# Patient Record
Sex: Female | Born: 1965 | Race: White | Hispanic: No | Marital: Married | Smoking: Never smoker
Health system: Southern US, Community
[De-identification: ages and names within clinical notes are randomized; demographics above are authoritative.]

## PROBLEM LIST (undated history)

## (undated) DIAGNOSIS — H6992 Unspecified Eustachian tube disorder, left ear: Secondary | ICD-10-CM

## (undated) DIAGNOSIS — R002 Palpitations: Secondary | ICD-10-CM

## (undated) DIAGNOSIS — R5383 Other fatigue: Secondary | ICD-10-CM

## (undated) DIAGNOSIS — K219 Gastro-esophageal reflux disease without esophagitis: Secondary | ICD-10-CM

## (undated) HISTORY — DX: Palpitations: R00.2

## (undated) HISTORY — PX: OTHER SURGICAL HISTORY: SHX169

## (undated) HISTORY — PX: ABDOMINAL HYSTERECTOMY: SHX81

## (undated) HISTORY — DX: Gastro-esophageal reflux disease without esophagitis: K21.9

## (undated) HISTORY — DX: Unspecified Eustachian tube disorder, left ear: H69.92

## (undated) HISTORY — DX: Other fatigue: R53.83

## (undated) HISTORY — PX: BREAST BIOPSY: SHX20

## (undated) HISTORY — PX: CHOLECYSTECTOMY: SHX55

---

## 2007-07-31 ENCOUNTER — Ambulatory Visit: Payer: Self-pay | Admitting: Cardiology

## 2011-12-14 DIAGNOSIS — R072 Precordial pain: Secondary | ICD-10-CM

## 2013-10-15 ENCOUNTER — Other Ambulatory Visit: Payer: Self-pay | Admitting: Unknown Physician Specialty

## 2013-10-15 DIAGNOSIS — R921 Mammographic calcification found on diagnostic imaging of breast: Secondary | ICD-10-CM

## 2013-10-22 ENCOUNTER — Ambulatory Visit
Admission: RE | Admit: 2013-10-22 | Discharge: 2013-10-22 | Disposition: A | Discharge status: Home / Self Care | Payer: BC Managed Care – PPO | Source: Ambulatory Visit | Attending: Unknown Physician Specialty | Admitting: Unknown Physician Specialty

## 2013-10-22 ENCOUNTER — Other Ambulatory Visit: Payer: Self-pay | Admitting: Unknown Physician Specialty

## 2013-10-22 DIAGNOSIS — R921 Mammographic calcification found on diagnostic imaging of breast: Secondary | ICD-10-CM

## 2014-05-05 ENCOUNTER — Other Ambulatory Visit: Payer: Self-pay | Admitting: Unknown Physician Specialty

## 2014-05-05 DIAGNOSIS — R921 Mammographic calcification found on diagnostic imaging of breast: Secondary | ICD-10-CM

## 2014-06-24 ENCOUNTER — Ambulatory Visit
Admission: RE | Admit: 2014-06-24 | Discharge: 2014-06-24 | Disposition: A | Discharge status: Home / Self Care | Payer: BC Managed Care – PPO | Source: Ambulatory Visit | Attending: Unknown Physician Specialty | Admitting: Unknown Physician Specialty

## 2014-06-24 DIAGNOSIS — R921 Mammographic calcification found on diagnostic imaging of breast: Secondary | ICD-10-CM

## 2014-07-04 ENCOUNTER — Other Ambulatory Visit: Payer: Self-pay

## 2014-07-04 DIAGNOSIS — Z1231 Encounter for screening mammogram for malignant neoplasm of breast: Secondary | ICD-10-CM

## 2014-09-03 ENCOUNTER — Ambulatory Visit
Admission: RE | Admit: 2014-09-03 | Discharge: 2014-09-03 | Disposition: A | Discharge status: Home / Self Care | Payer: BLUE CROSS/BLUE SHIELD | Source: Ambulatory Visit

## 2014-09-03 DIAGNOSIS — Z1231 Encounter for screening mammogram for malignant neoplasm of breast: Secondary | ICD-10-CM

## 2015-09-29 ENCOUNTER — Other Ambulatory Visit: Payer: Self-pay

## 2015-09-29 DIAGNOSIS — Z1231 Encounter for screening mammogram for malignant neoplasm of breast: Secondary | ICD-10-CM

## 2015-11-05 ENCOUNTER — Ambulatory Visit
Admission: RE | Admit: 2015-11-05 | Discharge: 2015-11-05 | Disposition: A | Discharge status: Home / Self Care | Payer: BLUE CROSS/BLUE SHIELD | Source: Ambulatory Visit

## 2015-11-05 DIAGNOSIS — Z1231 Encounter for screening mammogram for malignant neoplasm of breast: Secondary | ICD-10-CM

## 2016-09-30 ENCOUNTER — Other Ambulatory Visit: Payer: Self-pay | Admitting: Unknown Physician Specialty

## 2016-09-30 DIAGNOSIS — Z1231 Encounter for screening mammogram for malignant neoplasm of breast: Secondary | ICD-10-CM

## 2016-11-07 ENCOUNTER — Ambulatory Visit
Admission: RE | Admit: 2016-11-07 | Discharge: 2016-11-07 | Disposition: A | Discharge status: Home / Self Care | Payer: BLUE CROSS/BLUE SHIELD | Source: Ambulatory Visit | Attending: Unknown Physician Specialty | Admitting: Unknown Physician Specialty

## 2016-11-07 DIAGNOSIS — Z1231 Encounter for screening mammogram for malignant neoplasm of breast: Secondary | ICD-10-CM

## 2017-10-16 ENCOUNTER — Other Ambulatory Visit: Payer: Self-pay | Admitting: Unknown Physician Specialty

## 2017-10-16 DIAGNOSIS — Z1231 Encounter for screening mammogram for malignant neoplasm of breast: Secondary | ICD-10-CM

## 2017-11-13 ENCOUNTER — Ambulatory Visit: Payer: BLUE CROSS/BLUE SHIELD

## 2017-12-05 ENCOUNTER — Ambulatory Visit
Admission: RE | Admit: 2017-12-05 | Discharge: 2017-12-05 | Disposition: A | Discharge status: Home / Self Care | Payer: BLUE CROSS/BLUE SHIELD | Source: Ambulatory Visit | Attending: Unknown Physician Specialty | Admitting: Unknown Physician Specialty

## 2017-12-05 DIAGNOSIS — Z1231 Encounter for screening mammogram for malignant neoplasm of breast: Secondary | ICD-10-CM

## 2018-05-28 ENCOUNTER — Encounter: Payer: Self-pay | Admitting: Internal Medicine

## 2018-11-13 ENCOUNTER — Other Ambulatory Visit: Payer: Self-pay | Admitting: Unknown Physician Specialty

## 2018-11-13 DIAGNOSIS — Z1231 Encounter for screening mammogram for malignant neoplasm of breast: Secondary | ICD-10-CM

## 2018-11-23 ENCOUNTER — Encounter: Payer: Self-pay | Admitting: Internal Medicine

## 2018-12-24 ENCOUNTER — Ambulatory Visit (INDEPENDENT_AMBULATORY_CARE_PROVIDER_SITE_OTHER): Payer: BLUE CROSS/BLUE SHIELD | Admitting: Internal Medicine

## 2019-01-09 ENCOUNTER — Other Ambulatory Visit: Payer: Self-pay

## 2019-01-09 ENCOUNTER — Ambulatory Visit
Admission: RE | Admit: 2019-01-09 | Discharge: 2019-01-09 | Disposition: A | Discharge status: Home / Self Care | Payer: BC Managed Care – PPO | Source: Ambulatory Visit | Attending: Unknown Physician Specialty | Admitting: Unknown Physician Specialty

## 2019-01-09 DIAGNOSIS — Z1231 Encounter for screening mammogram for malignant neoplasm of breast: Secondary | ICD-10-CM

## 2019-01-30 ENCOUNTER — Encounter (INDEPENDENT_AMBULATORY_CARE_PROVIDER_SITE_OTHER): Payer: Self-pay | Admitting: Internal Medicine

## 2019-01-30 ENCOUNTER — Ambulatory Visit (INDEPENDENT_AMBULATORY_CARE_PROVIDER_SITE_OTHER): Payer: BC Managed Care – PPO | Admitting: Internal Medicine

## 2019-01-30 ENCOUNTER — Other Ambulatory Visit: Payer: Self-pay

## 2019-01-30 VITALS — BP 132/82 | HR 88 | Temp 97.9°F | Ht 66.0 in | Wt 193.3 lb

## 2019-01-30 DIAGNOSIS — R109 Unspecified abdominal pain: Secondary | ICD-10-CM | POA: Insufficient documentation

## 2019-01-30 DIAGNOSIS — R1010 Upper abdominal pain, unspecified: Secondary | ICD-10-CM | POA: Diagnosis not present

## 2019-01-30 DIAGNOSIS — K529 Noninfective gastroenteritis and colitis, unspecified: Secondary | ICD-10-CM

## 2019-01-30 LAB — CBC
HCT: 35.9 % (ref 35.0–45.0)
Hemoglobin: 12 g/dL (ref 11.7–15.5)
MCH: 28 pg (ref 27.0–33.0)
MCHC: 33.4 g/dL (ref 32.0–36.0)
MCV: 83.7 fL (ref 80.0–100.0)
MPV: 11.8 fL (ref 7.5–12.5)
Platelets: 265 10*3/uL (ref 140–400)
RBC: 4.29 10*6/uL (ref 3.80–5.10)
RDW: 12.7 % (ref 11.0–15.0)
WBC: 7.1 10*3/uL (ref 3.8–10.8)

## 2019-01-30 LAB — SEDIMENTATION RATE: Sed Rate: 22 mm/h (ref 0–30)

## 2019-01-30 NOTE — Progress Notes (Addendum)
   Subjective:    Patient ID: Jillian Holt, female    DOB: 27-Sep-1965, 53 y.o.   MRN: [id number]  HPI Referred by Dr. Ladona Horns for possible enteritis. Saw Dr. Quintin Alto for abdominal pain back in May.   She was placed on Cipro and Flagyl after she underwent the CT in May.  She says the pain is  better. She tells me today, the spasms sensation is some better. She does occasionally have abdominal spasms in her upper abdomen. Marland Kitchen Appetite is good. Has gained 25 pounds in the past 2 years. Her BMs move okay.  No change in her appetite.   05/28/2018 Colonoscopy Dr Anthony Sar. Screening . Normal.   11/23/2018 CT  Abdomen/pelvis with CM: Long segment mild wall thickening and somewhat patulous appearance of the descending and sigmoid though conceivably an enteritis could have a similar appearance.   Review of Systems Past Medical History:  Diagnosis Date  . GERD (gastroesophageal reflux disease)       Allergies  Allergen Reactions  . Bactrim [Sulfamethoxazole-Trimethoprim]     Rash   . Erythromycin     Cannot tolerate  . Penicillins     rash    Current Outpatient Medications on File Prior to Visit  Medication Sig Dispense Refill  . Cholecalciferol (VITAMIN D3) 10 MCG (400 UNIT) CAPS Take by mouth.    . citalopram (CELEXA) 20 MG tablet Take 20 mg by mouth daily.    . cyanocobalamin 100 MCG tablet Take 100 mcg by mouth. As needed    . Ferrous Gluconate (IRON 27 PO) Take by mouth. As needed    . Multiple Vitamin (MULTIVITAMIN) tablet Take 1 tablet by mouth daily.    Marland Kitchen omeprazole (PRILOSEC) 20 MG capsule Take 20 mg by mouth daily.    . Zinc Sulfate (ZINC 15 PO) Take by mouth. As needed     No current facility-administered medications on file prior to visit.         Objective:   Physical Exam Blood pressure 132/82, pulse 88, temperature 97.9 F (36.6 C), height 5\' 6"  (1.676 m), weight 193 lb 4.8 oz (87.7 kg), last menstrual period 01/09/2019. Alert and oriented. Skin warm and dry. Oral mucosa  is moist.   . Sclera anicteric, conjunctivae is pink. Thyroid not enlarged. No cervical lymphadenopathy. Lungs clear. Heart regular rate and rhythm.  Abdomen is soft. Bowel sounds are positive. No hepatomegaly. No abdominal masses felt. No tenderness.  No edema to lower extremities.         Assessment & Plan:  Abdominal pain, possible ernteritis, resolved. Will get a CBC and sedrate. Further recommendations to follow.

## 2019-01-30 NOTE — Patient Instructions (Signed)
Gastroesophageal Reflux Disease, Adult Gastroesophageal reflux (GER) happens when acid from the stomach flows up into the tube that connects the mouth and the stomach (esophagus). Normally, food travels down the esophagus and stays in the stomach to be digested. However, when a person has GER, food and stomach acid sometimes move back up into the esophagus. If this becomes a more serious problem, the person may be diagnosed with a disease called gastroesophageal reflux disease (GERD). GERD occurs when the reflux:  Happens often.  Causes frequent or severe symptoms.  Causes problems such as damage to the esophagus. When stomach acid comes in contact with the esophagus, the acid may cause soreness (inflammation) in the esophagus. Over time, GERD may create small holes (ulcers) in the lining of the esophagus. What are the causes? This condition is caused by a problem with the muscle between the esophagus and the stomach (lower esophageal sphincter, or LES). Normally, the LES muscle closes after food passes through the esophagus to the stomach. When the LES is weakened or abnormal, it does not close properly, and that allows food and stomach acid to go back up into the esophagus. The LES can be weakened by certain dietary substances, medicines, and medical conditions, including:  Tobacco use.  Pregnancy.  Having a hiatal hernia.  Alcohol use.  Certain foods and beverages, such as coffee, chocolate, onions, and peppermint. What increases the risk? You are more likely to develop this condition if you:  Have an increased body weight.  Have a connective tissue disorder.  Use NSAID medicines. What are the signs or symptoms? Symptoms of this condition include:  Heartburn.  Difficult or painful swallowing.  The feeling of having a lump in the throat.  Abitter taste in the mouth.  Bad breath.  Having a large amount of saliva.  Having an upset or bloated stomach.  Belching.   Chest pain. Different conditions can cause chest pain. Make sure you see your health care provider if you experience chest pain.  Shortness of breath or wheezing.  Ongoing (chronic) cough or a night-time cough.  Wearing away of tooth enamel.  Weight loss. How is this diagnosed? Your health care provider will take a medical history and perform a physical exam. To determine if you have mild or severe GERD, your health care provider may also monitor how you respond to treatment. You may also have tests, including:  A test to examine your stomach and esophagus with a small camera (endoscopy).  A test thatmeasures the acidity level in your esophagus.  A test thatmeasures how much pressure is on your esophagus.  A barium swallow or modified barium swallow test to show the shape, size, and functioning of your esophagus. How is this treated? The goal of treatment is to help relieve your symptoms and to prevent complications. Treatment for this condition may vary depending on how severe your symptoms are. Your health care provider may recommend:  Changes to your diet.  Medicine.  Surgery. Follow these instructions at home: Eating and drinking   Follow a diet as recommended by your health care provider. This may involve avoiding foods and drinks such as: ? Coffee and tea (with or without caffeine). ? Drinks that containalcohol. ? Energy drinks and sports drinks. ? Carbonated drinks or sodas. ? Chocolate and cocoa. ? Peppermint and mint flavorings. ? Garlic and onions. ? Horseradish. ? Spicy and acidic foods, including peppers, chili powder, curry powder, vinegar, hot sauces, and barbecue sauce. ? Citrus fruit juices and citrus   fruits, such as oranges, lemons, and limes. ? Tomato-based foods, such as red sauce, chili, salsa, and pizza with red sauce. ? Fried and fatty foods, such as donuts, french fries, potato chips, and high-fat dressings. ? High-fat meats, such as hot dogs and  fatty cuts of red and white meats, such as rib eye steak, sausage, ham, and bacon. ? High-fat dairy items, such as whole milk, butter, and cream cheese.  Eat small, frequent meals instead of large meals.  Avoid drinking large amounts of liquid with your meals.  Avoid eating meals during the 2-3 hours before bedtime.  Avoid lying down right after you eat.  Do not exercise right after you eat. Lifestyle   Do not use any products that contain nicotine or tobacco, such as cigarettes, e-cigarettes, and chewing tobacco. If you need help quitting, ask your health care provider.  Try to reduce your stress by using methods such as yoga or meditation. If you need help reducing stress, ask your health care provider.  If you are overweight, reduce your weight to an amount that is healthy for you. Ask your health care provider for guidance about a safe weight loss goal. General instructions  Pay attention to any changes in your symptoms.  Take over-the-counter and prescription medicines only as told by your health care provider. Do not take aspirin, ibuprofen, or other NSAIDs unless your health care provider told you to do so.  Wear loose-fitting clothing. Do not wear anything tight around your waist that causes pressure on your abdomen.  Raise (elevate) the head of your bed about 6 inches (15 cm).  Avoid bending over if this makes your symptoms worse.  Keep all follow-up visits as told by your health care provider. This is important. Contact a health care provider if:  You have: ? New symptoms. ? Unexplained weight loss. ? Difficulty swallowing or it hurts to swallow. ? Wheezing or a persistent cough. ? A hoarse voice.  Your symptoms do not improve with treatment. Get help right away if you:  Have pain in your arms, neck, jaw, teeth, or back.  Feel sweaty, dizzy, or light-headed.  Have chest pain or shortness of breath.  Vomit and your vomit looks like blood or coffee grounds.   Faint.  Have stool that is bloody or black.  Cannot swallow, drink, or eat. Summary  Gastroesophageal reflux happens when acid from the stomach flows up into the esophagus. GERD is a disease in which the reflux happens often, causes frequent or severe symptoms, or causes problems such as damage to the esophagus.  Treatment for this condition may vary depending on how severe your symptoms are. Your health care provider may recommend diet and lifestyle changes, medicine, or surgery.  Contact a health care provider if you have new or worsening symptoms.  Take over-the-counter and prescription medicines only as told by your health care provider. Do not take aspirin, ibuprofen, or other NSAIDs unless your health care provider told you to do so.  Keep all follow-up visits as told by your health care provider. This is important. This information is not intended to replace advice given to you by your health care provider. Make sure you discuss any questions you have with your health care provider. Document Released: 04/13/2005 Document Revised: 01/10/2018 Document Reviewed: 01/10/2018 Elsevier Patient Education  2020 [employer]  

## 2019-12-10 ENCOUNTER — Other Ambulatory Visit: Payer: Self-pay | Admitting: Family Medicine

## 2019-12-10 ENCOUNTER — Other Ambulatory Visit: Payer: Self-pay | Admitting: Unknown Physician Specialty

## 2019-12-10 DIAGNOSIS — Z1231 Encounter for screening mammogram for malignant neoplasm of breast: Secondary | ICD-10-CM

## 2020-01-13 ENCOUNTER — Other Ambulatory Visit: Payer: Self-pay

## 2020-01-13 ENCOUNTER — Ambulatory Visit: Payer: BC Managed Care – PPO

## 2020-01-13 ENCOUNTER — Ambulatory Visit
Admission: RE | Admit: 2020-01-13 | Discharge: 2020-01-13 | Disposition: A | Discharge status: Home / Self Care | Payer: BC Managed Care – PPO | Source: Ambulatory Visit | Attending: Unknown Physician Specialty | Admitting: Unknown Physician Specialty

## 2020-01-13 DIAGNOSIS — Z1231 Encounter for screening mammogram for malignant neoplasm of breast: Secondary | ICD-10-CM

## 2021-01-28 ENCOUNTER — Other Ambulatory Visit: Payer: Self-pay | Admitting: Family Medicine

## 2021-01-28 DIAGNOSIS — Z1231 Encounter for screening mammogram for malignant neoplasm of breast: Secondary | ICD-10-CM

## 2021-03-26 ENCOUNTER — Ambulatory Visit
Admission: RE | Admit: 2021-03-26 | Discharge: 2021-03-26 | Disposition: A | Discharge status: Home / Self Care | Payer: BC Managed Care – PPO | Source: Ambulatory Visit | Attending: Family Medicine | Admitting: Family Medicine

## 2021-03-26 ENCOUNTER — Other Ambulatory Visit: Payer: Self-pay

## 2021-03-26 DIAGNOSIS — Z1231 Encounter for screening mammogram for malignant neoplasm of breast: Secondary | ICD-10-CM

## 2022-02-16 ENCOUNTER — Other Ambulatory Visit: Payer: Self-pay | Admitting: Obstetrics and Gynecology

## 2022-02-16 DIAGNOSIS — Z1231 Encounter for screening mammogram for malignant neoplasm of breast: Secondary | ICD-10-CM

## 2022-03-28 ENCOUNTER — Ambulatory Visit
Admission: RE | Admit: 2022-03-28 | Discharge: 2022-03-28 | Disposition: A | Discharge status: Home / Self Care | Payer: BC Managed Care – PPO | Source: Ambulatory Visit | Attending: Obstetrics and Gynecology | Admitting: Obstetrics and Gynecology

## 2022-03-28 DIAGNOSIS — Z1231 Encounter for screening mammogram for malignant neoplasm of breast: Secondary | ICD-10-CM

## 2022-11-02 IMAGING — MG MM DIGITAL SCREENING BILAT W/ TOMO AND CAD
8 series · 8 of 24 positions shown · non-contrast
Comparison: Previous exam(s).

CLINICAL DATA: Screening.

EXAM:
DIGITAL SCREENING BILATERAL MAMMOGRAM WITH TOMOSYNTHESIS AND CAD
TECHNIQUE: Bilateral screening digital craniocaudal and mediolateral oblique
mammograms were obtained. Bilateral screening digital breast
tomosynthesis was performed. The images were evaluated with
computer-aided detection.

[L CC synth-2D]
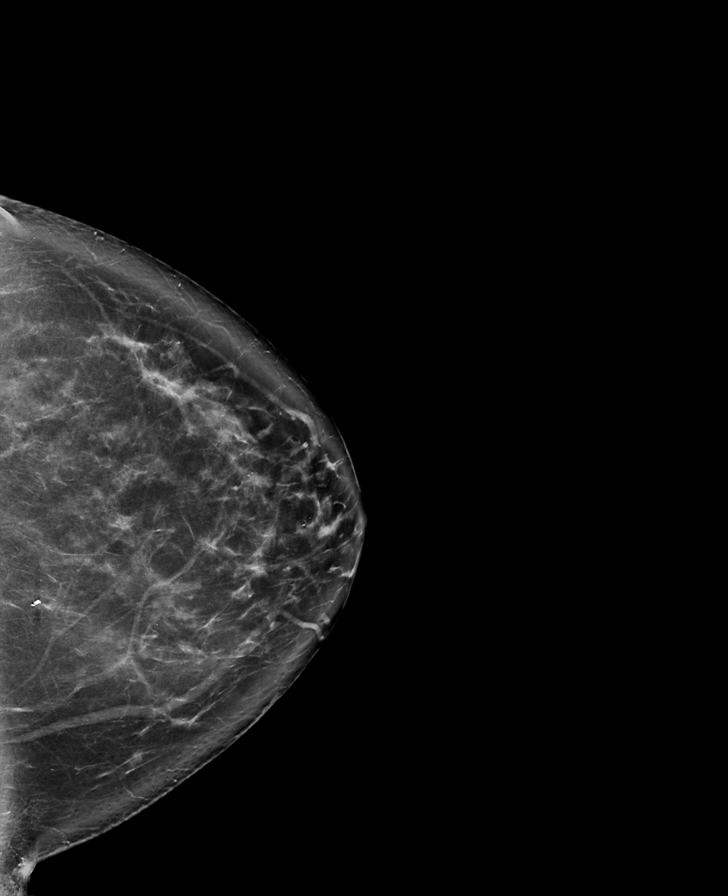

[R CC synth-2D]
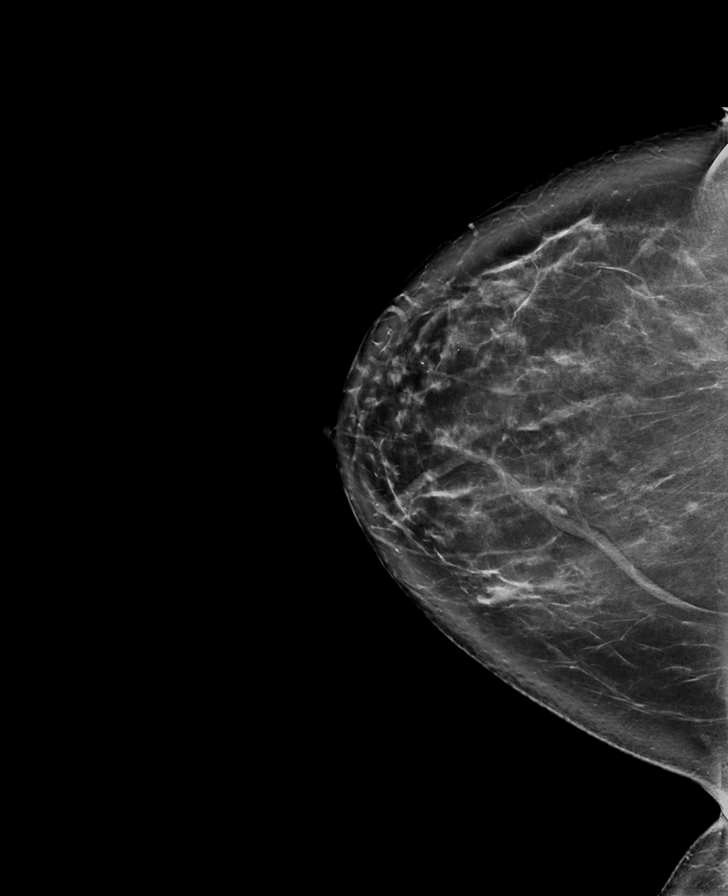

[L MLO synth-2D]
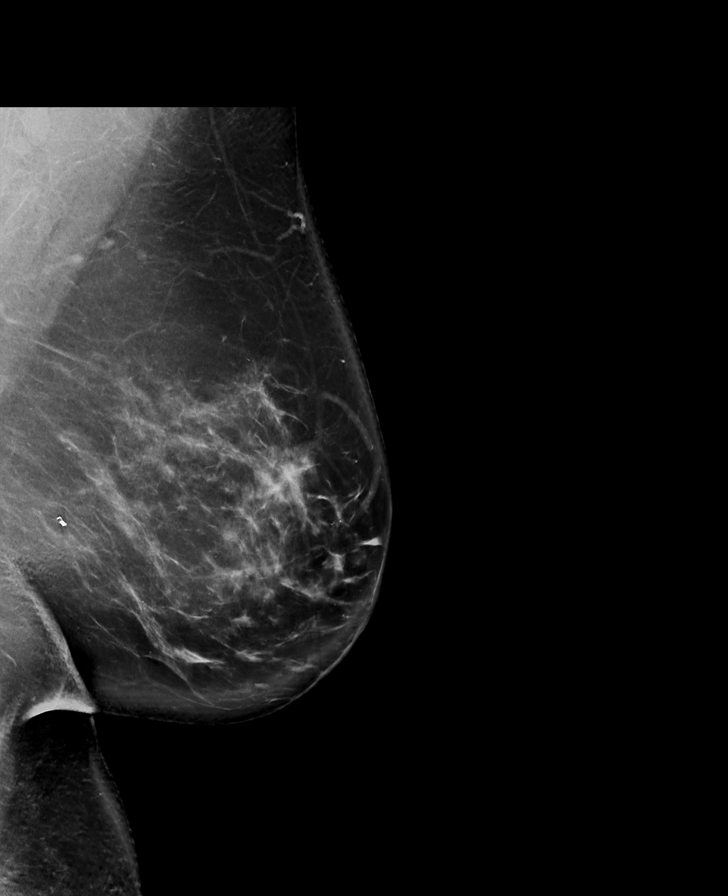

[R MLO synth-2D]
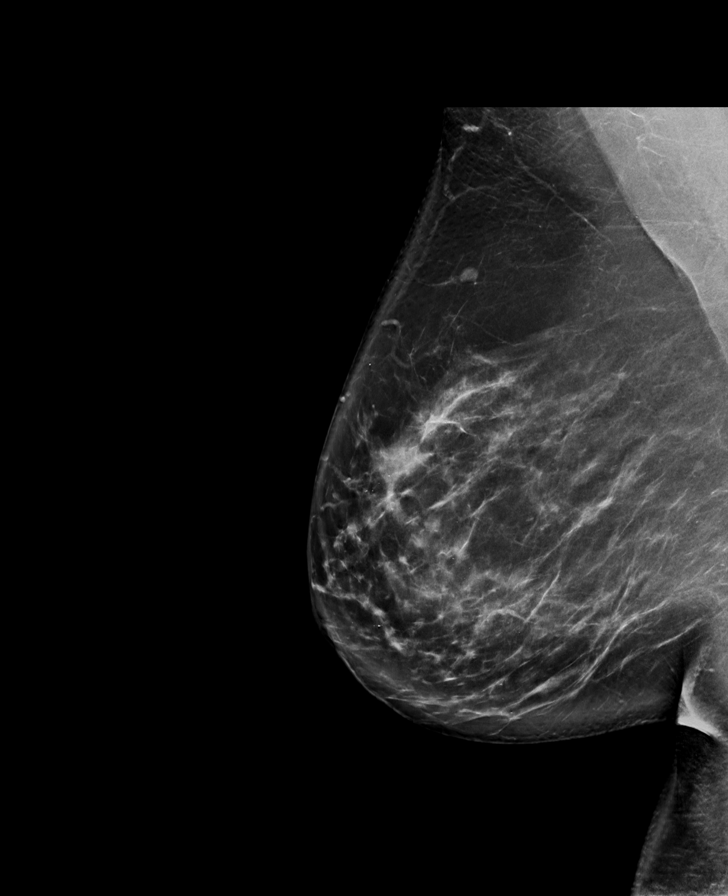

[R MLO tomo · tomo slice 53/104.0]
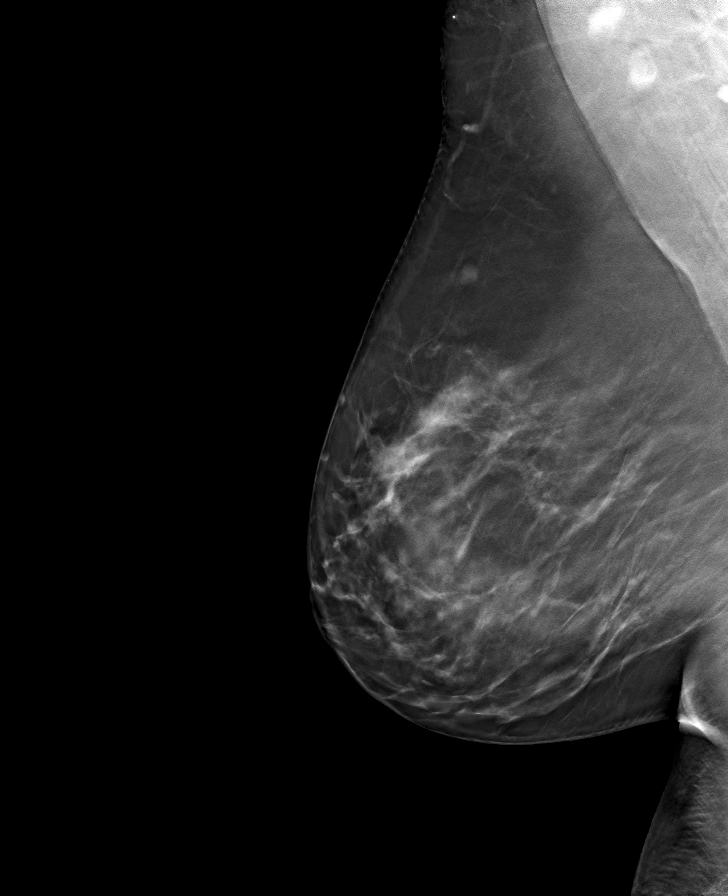

[L MLO tomo · tomo slice 54/107.0]
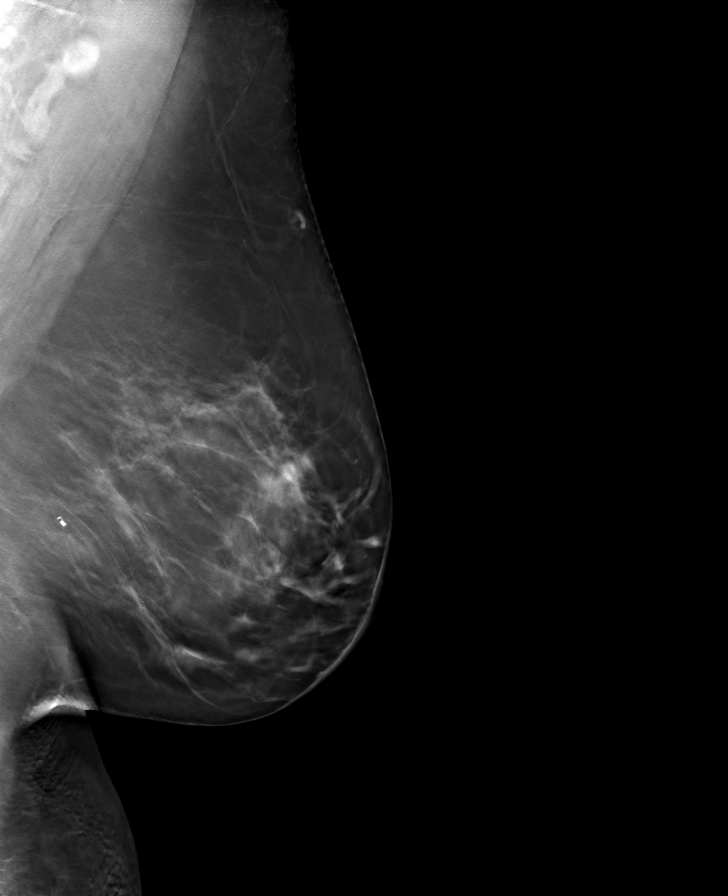

[R CC tomo · tomo slice 49/98.0]
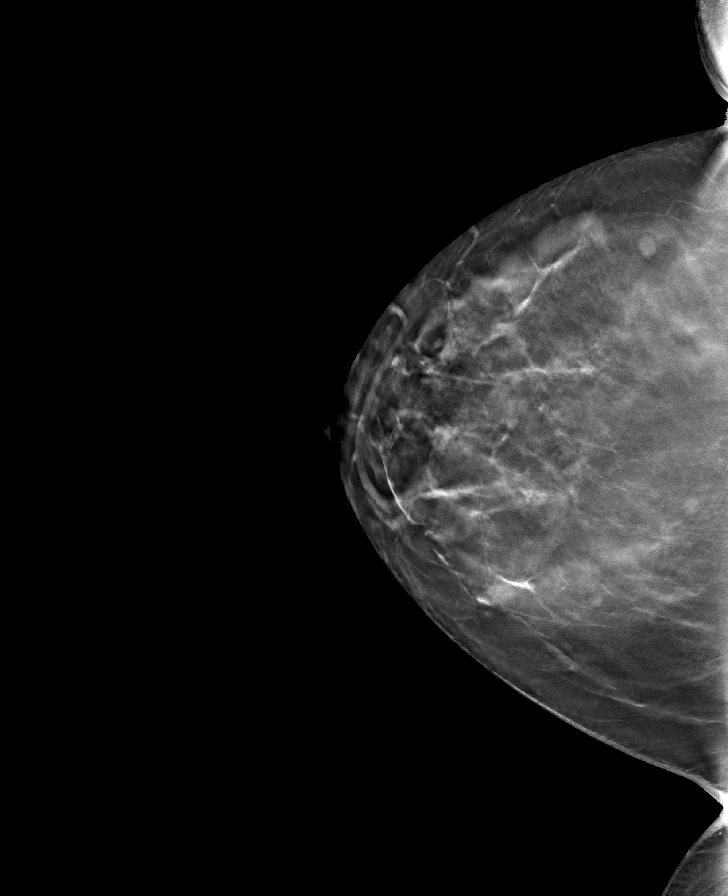

[L CC tomo · tomo slice 47/93.0]
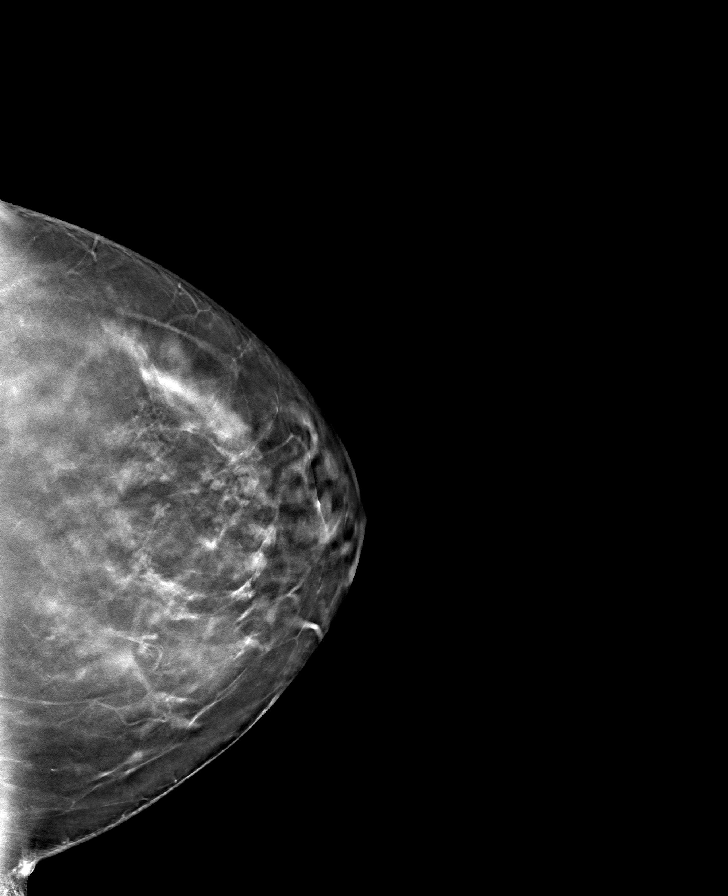

[8 of 24 positions shown; findings below may reference images not displayed]

ACR Breast Density Category b: There are scattered areas of
fibroglandular density.
FINDINGS: There are no findings suspicious for malignancy.
IMPRESSION: No mammographic evidence of malignancy. A result letter of this
screening mammogram will be mailed directly to the patient.

RECOMMENDATION:
Screening mammogram in one year. (Code:51-O-LD2)

BI-RADS CATEGORY  1: Negative.

## 2022-12-16 ENCOUNTER — Encounter: Payer: Self-pay | Admitting: *Deleted

## 2022-12-16 ENCOUNTER — Encounter: Payer: Self-pay | Admitting: Internal Medicine

## 2022-12-19 ENCOUNTER — Ambulatory Visit: Payer: BC Managed Care – PPO | Attending: Internal Medicine | Admitting: Internal Medicine

## 2022-12-19 ENCOUNTER — Encounter: Payer: Self-pay | Admitting: Internal Medicine

## 2022-12-19 VITALS — BP 130/80 | HR 80 | Ht 66.0 in | Wt 203.4 lb

## 2022-12-19 DIAGNOSIS — R0789 Other chest pain: Secondary | ICD-10-CM | POA: Diagnosis not present

## 2022-12-19 DIAGNOSIS — R002 Palpitations: Secondary | ICD-10-CM

## 2022-12-19 DIAGNOSIS — R079 Chest pain, unspecified: Secondary | ICD-10-CM | POA: Diagnosis not present

## 2022-12-19 MED ORDER — NITROGLYCERIN 0.4 MG SL SUBL: 0.4000 mg | SUBLINGUAL_TABLET | SUBLINGUAL | 2 refills | Status: AC | PRN

## 2022-12-19 MED ORDER — METOPROLOL TARTRATE 100 MG PO TABS: 100.0000 mg | ORAL_TABLET | Freq: Once | ORAL | 0 refills | Status: DC

## 2022-12-19 NOTE — Patient Instructions (Addendum)
Medication Instructions:  Your physician has recommended you make the following change in your medication:  Nitroglycerin 0.4 mg SL PRN Dissolve one under tongue for chest pain every 5 minutes up to 3 doses. If no relief, proceed to ED.   Labwork: BMET at [facility]  Testing/Procedures: Your physician has requested that you have an echocardiogram. Echocardiography is a painless test that uses sound waves to create images of your heart. It provides your doctor with information about the size and shape of your heart and how well your heart's chambers and valves are working. This procedure takes approximately one hour. There are no restrictions for this procedure. Please do NOT wear cologne, perfume, aftershave, or lotions (deodorant is allowed). Please arrive 15 minutes prior to your appointment time.  Non-Cardiac CT Angiography (CTA), is a special type of CT scan that uses a computer to produce multi-dimensional views of major blood vessels throughout the body. In CT angiography, a contrast material is injected through an IV to help visualize the blood vessels   Follow-Up: Your physician recommends that you schedule a follow-up appointment in: Pending   Any Other Special Instructions Will Be Listed Below (If Applicable).    Your cardiac CT will be scheduled at one of the below locations:   [facility] [address] [place], [place] [address] [phone]   If scheduled at [facility], please arrive at the [facility] and Children's Entrance (Entrance C2) of [facility] 30 minutes prior to test start time. You can use the FREE valet parking offered at entrance C (encouraged to control the heart rate for the test)  Proceed to the [facility] Radiology Department (first floor) to check-in and test prep.  All radiology patients and guests should use entrance C2 at [facility], accessed from [facility], even though the hospital's physical address  listed is [address]    If scheduled at [facility] or [facility], please arrive 15 mins early for check-in and test prep.   On the Night Before the Test: Be sure to Drink plenty of water. Do not consume any caffeinated/decaffeinated beverages or chocolate 12 hours prior to your test. Do not take any antihistamines 12 hours prior to your test. (Hold Zyrtec ) If the patient has contrast allergy: No allergy to contrast   On the Day of the Test: Drink plenty of water until 1 hour prior to the test. Do not eat any food 1 hour prior to test. You may take your regular medications prior to the test.  Take metoprolol 100mg (Lopressor) two hours prior to test. FEMALES- please wear underwire-free bra if available, avoid dresses & tight clothing       After the Test: Drink plenty of water. After receiving IV contrast, you may experience a mild flushed feeling. This is normal. On occasion, you may experience a mild rash up to 24 hours after the test. This is not dangerous. If this occurs, you can take Benadryl 25 mg and increase your fluid intake. If you experience trouble breathing, this can be serious. If it is severe call 911 IMMEDIATELY. If it is mild, please call our office. If you take any of these medications: Glipizide/Metformin, Avandament, Glucavance, please do not take 48 hours after completing test unless otherwise instructed.  We will call to schedule your test 2-4 weeks out understanding that some insurance companies will need an authorization prior to the service being performed.   For non-scheduling  related questions, please contact the cardiac imaging nurse navigator should you have any questions/concerns: Rockwell Alexandria, Cardiac Imaging Nurse Navigator Larey Brick, Cardiac Imaging Nurse Navigator [facility] Direct Office Dial: [phone]   For scheduling needs, including  cancellations and rescheduling, please call [place], [phone].   If you need a refill on your cardiac medications before your next appointment, please call your pharmacy.

## 2022-12-19 NOTE — Progress Notes (Signed)
Cardiology Office Note  Date: 12/19/2022   ID: Trystin, Etten 1965-11-12, MRN [id number]  PCP:  Estanislado Pandy, MD  Cardiologist:  Marjo Bicker, MD Electrophysiologist:  None   Reason for Office Visit: Evaluation of chest pressure at the request of Leavy Cella, PA-C   History of Present Illness: Jillian Holt is a 57 y.o. female with no PMH was referred to cardiology clinic for evaluation of chest pressure.  EKG from PCPs office showed NSR and LBBB (LBBB was new compared to EKG from last year). Patient also started to have substernal chest pressure, at rest and with exertion, since 4/24, last for a few minutes and resolve spontaneously. These episodes are sporadic and does not have a specific frequency. The chest pressure also radiates to her left side of her neck. She is adopted and does not know any family history. Her grandfather (biologically related) has some heart issues.  Denies DOE but feels lethargic and has no energy all the time.  She has palpitations but occurs 1-2 times per month.  No dizziness,syncope, leg swelling.  Past Medical History:  Diagnosis Date   Dysfunction of left eustachian tube    GERD (gastroesophageal reflux disease)    Other fatigue    Palpitations     Past Surgical History:  Procedure Laterality Date   c section     CHOLECYSTECTOMY      Current Outpatient Medications  Medication Sig Dispense Refill   ascorbic acid (VITAMIN C) 500 MG tablet Take 500 mg by mouth daily.     cetirizine (ZYRTEC) 10 MG tablet Take 10 mg by mouth daily.     cholecalciferol (VITAMIN D3) 25 MCG (1000 UNIT) tablet Take 3,000 Units by mouth daily.     citalopram (CELEXA) 20 MG tablet Take 20 mg by mouth daily.     cyanocobalamin 100 MCG tablet Take 100 mcg by mouth. As needed     fluticasone (FLONASE) 50 MCG/ACT nasal spray Place 2 sprays into both nostrils daily.     Menaquinone-7 (VITAMIN K2) 100 MCG CAPS Take 1 capsule by mouth daily.     [START ON 12/20/2022]  metoprolol tartrate (LOPRESSOR) 100 MG tablet Take 1 tablet (100 mg total) by mouth once for 1 dose. 1 tablet 0   nitroGLYCERIN (NITROSTAT) 0.4 MG SL tablet Place 1 tablet (0.4 mg total) under the tongue every 5 (five) minutes x 3 doses as needed for chest pain (If no relief after 3rd dose, proceed to the ED or call 911). 25 tablet 2   omeprazole (PRILOSEC) 20 MG capsule Take 20 mg by mouth daily.     Zinc Sulfate (ZINC 15 PO) Take 1 tablet by mouth once a week. As needed     No current facility-administered medications for this visit.   Allergies:  Bactrim [sulfamethoxazole-trimethoprim], Erythromycin, and Penicillins   Social History: The patient  reports that she has never smoked. She has never used smokeless tobacco. She reports that she does not drink alcohol and does not use drugs.   Family History: The patient's family history is not on file.   ROS:  Please see the history of present illness. Otherwise, complete review of systems is positive for none  All other systems are reviewed and negative.   Physical Exam: VS:  BP 130/80   Pulse 80   Ht 5\' 6"  (1.676 m)   Wt 203 lb 6.4 oz (92.3 kg)   SpO2 96%   BMI 32.83 kg/m , BMI Body  mass index is 32.83 kg/m.  Wt Readings from Last 3 Encounters:  12/19/22 203 lb 6.4 oz (92.3 kg)  01/30/19 193 lb 4.8 oz (87.7 kg)    General: Patient appears comfortable at rest. HEENT: Conjunctiva and lids normal, oropharynx clear with moist mucosa. Neck: Supple, no elevated JVP or carotid bruits, no thyromegaly. Lungs: Clear to auscultation, nonlabored breathing at rest. Cardiac: Regular rate and rhythm, no S3 or significant systolic murmur, no pericardial rub. Abdomen: Soft, nontender, no hepatomegaly, bowel sounds present, no guarding or rebound. Extremities: No pitting edema, distal pulses 2+. Skin: Warm and dry. Musculoskeletal: No kyphosis. Neuropsychiatric: Alert and oriented x3, affect grossly appropriate.  Recent Labwork: No results  found for requested labs within last 365 days.  No results found for: "CHOL", "TRIG", "HDL", "CHOLHDL", "VLDL", "LDLCALC", "LDLDIRECT"  Other Studies Reviewed Today:   Assessment and Plan:  Patient is a 56 year old F with no PMH was referred to cardiology clinic for evaluation of chest pressure.  # Chest pressure -Chest pressure radiating to the left side of her neck, at rest and with exertion, lasting for up minutes and happens multiple times.  Does not recall the frequency.  Ongoing since 4/24. EKG showed NSR and LBBB (which is new compared to the prior EKG from last year).  SL NTG 0.4 mg as needed. -Obtain CTA cardiac -Obtain 2D echocardiogram   I have spent a total of 30 minutes with patient reviewing chart, EKGs, labs and examining patient as well as establishing an assessment and plan that was discussed with the patient.  > 50% of time was spent in direct patient care.    Medication Adjustments/Labs and Tests Ordered: Current medicines are reviewed at length with the patient today.  Concerns regarding medicines are outlined above.   Tests Ordered: Orders Placed This Encounter  Procedures   CT CORONARY MORPH W/CTA COR W/SCORE W/CA W/CM &/OR WO/CM   Basic metabolic panel   EKG 12-Lead   ECHOCARDIOGRAM COMPLETE    Medication Changes: Meds ordered this encounter  Medications   nitroGLYCERIN (NITROSTAT) 0.4 MG SL tablet    Sig: Place 1 tablet (0.4 mg total) under the tongue every 5 (five) minutes x 3 doses as needed for chest pain (If no relief after 3rd dose, proceed to the ED or call 911).    Dispense:  25 tablet    Refill:  2    01/15/2023-*NEW   metoprolol tartrate (LOPRESSOR) 100 MG tablet    Sig: Take 1 tablet (100 mg total) by mouth once for 1 dose.    Dispense:  1 tablet    Refill:  0    12/19/2022-One dose only    Disposition:  Follow up  pending results  Signed Alic Hilburn Verne Spurr, MD, 12/19/2022 3:54 PM    [facility] at  [facility] [address] [place], [place], [place] [address]

## 2022-12-30 ENCOUNTER — Encounter: Payer: Self-pay | Admitting: Internal Medicine

## 2022-12-30 LAB — BASIC METABOLIC PANEL
BUN/Creatinine Ratio: 17 (ref 9–23)
BUN: 13 mg/dL (ref 6–24)
CO2: 23 mmol/L (ref 20–29)
Calcium: 9.4 mg/dL (ref 8.7–10.2)
Chloride: 102 mmol/L (ref 96–106)
Creatinine, Ser: 0.77 mg/dL (ref 0.57–1.00)
Glucose: 87 mg/dL (ref 70–99)
Potassium: 4.4 mmol/L (ref 3.5–5.2)
Sodium: 140 mmol/L (ref 134–144)
eGFR: 91 mL/min/{1.73_m2} (ref >59)

## 2023-01-04 ENCOUNTER — Encounter: Payer: Self-pay | Admitting: *Deleted

## 2023-01-06 ENCOUNTER — Ambulatory Visit (HOSPITAL_COMMUNITY)
Admission: RE | Admit: 2023-01-06 | Discharge: 2023-01-06 | Disposition: A | Discharge status: Home / Self Care | Payer: BC Managed Care – PPO | Source: Ambulatory Visit | Attending: Internal Medicine | Admitting: Internal Medicine

## 2023-01-06 DIAGNOSIS — R079 Chest pain, unspecified: Secondary | ICD-10-CM | POA: Diagnosis not present

## 2023-01-06 DIAGNOSIS — R9431 Abnormal electrocardiogram [ECG] [EKG]: Secondary | ICD-10-CM | POA: Diagnosis not present

## 2023-01-06 MED ORDER — NITROGLYCERIN 0.4 MG SL SUBL
SUBLINGUAL_TABLET | SUBLINGUAL | Status: AC
  Filled 2023-01-06: qty 2

## 2023-01-06 MED ORDER — NITROGLYCERIN 0.4 MG SL SUBL
0.8000 mg | SUBLINGUAL_TABLET | Freq: Once | SUBLINGUAL | Status: AC
  Administered 2023-01-06: 0.8 mg via SUBLINGUAL

## 2023-01-06 MED ORDER — IOHEXOL 350 MG/ML SOLN
95.0000 mL | Freq: Once | INTRAVENOUS | Status: AC | PRN
  Administered 2023-01-06: 95 mL via INTRAVENOUS

## 2023-01-09 ENCOUNTER — Telehealth: Payer: Self-pay

## 2023-01-09 ENCOUNTER — Telehealth: Payer: Self-pay | Admitting: Internal Medicine

## 2023-01-09 MED ORDER — ROSUVASTATIN CALCIUM 10 MG PO TABS: 10.0000 mg | ORAL_TABLET | Freq: Every day | ORAL | 3 refills | Status: DC

## 2023-01-09 NOTE — Telephone Encounter (Signed)
-----   Message from Marjo Bicker, MD sent at 01/09/2023 12:14 PM EDT ----- Coronary calcium score is 39 (90th percentile for age and sex matched control).  Minimal plaque in the heart vessels. Start moderate intensity statin, rosuvastatin 10 mg nightly (side effects are myalgias).if patient happens to experience myalgias, can switch rosuvastatin to 3 times a week and reevaluate symptoms. Follow-up with PCP for noncardiac causes of chest pain.  Recommend heart healthy diet and exercise regimen (30 minutes/day for 5 days a week).

## 2023-01-09 NOTE — Telephone Encounter (Signed)
Attempted to call pt back- phone went to voicemail.

## 2023-01-09 NOTE — Telephone Encounter (Signed)
Patient notified and verbalized understanding. PCP copied. 

## 2023-01-09 NOTE — Telephone Encounter (Signed)
Patient returned call for her test results.  °

## 2023-01-13 ENCOUNTER — Telehealth: Payer: Self-pay | Admitting: Internal Medicine

## 2023-01-13 NOTE — Telephone Encounter (Signed)
Checking percert on the following patient for testing scheduled at [facility].   ECHO   02/15/2023

## 2023-02-15 ENCOUNTER — Ambulatory Visit (HOSPITAL_COMMUNITY)
Admission: RE | Admit: 2023-02-15 | Discharge: 2023-02-15 | Disposition: A | Discharge status: Home / Self Care | Payer: BC Managed Care – PPO | Source: Ambulatory Visit | Attending: Internal Medicine | Admitting: Internal Medicine

## 2023-02-15 DIAGNOSIS — R079 Chest pain, unspecified: Secondary | ICD-10-CM

## 2023-02-15 MED ORDER — PERFLUTREN LIPID MICROSPHERE
1.0000 mL | INTRAVENOUS | Status: AC | PRN
  Administered 2023-02-15: 3 mL via INTRAVENOUS

## 2023-02-15 NOTE — Progress Notes (Signed)
*  PRELIMINARY RESULTS* Echocardiogram 2D Echocardiogram has been performed with Definity.  Stacey Drain 02/15/2023, 4:42 PM

## 2023-02-24 ENCOUNTER — Telehealth: Payer: Self-pay

## 2023-02-24 MED ORDER — METOPROLOL SUCCINATE ER 25 MG PO TB24: 25.0000 mg | ORAL_TABLET | Freq: Every day | ORAL | 3 refills | Status: DC

## 2023-02-24 MED ORDER — LOSARTAN POTASSIUM 25 MG PO TABS: 25.0000 mg | ORAL_TABLET | Freq: Every day | ORAL | 3 refills | Status: DC

## 2023-02-24 NOTE — Telephone Encounter (Signed)
Patient notified and verbalized understanding. PCP copied. 

## 2023-02-24 NOTE — Telephone Encounter (Signed)
-----   Message from Vishnu P Mallipeddi sent at 02/22/2023  6:17 PM EDT ----- Heart pumping function is mildly reduced at 45% (55 and more than 55 is considered to be normal), no valvular heart disease. CT cardiac showed no evidence of CAD which is reassuring. Need to start GDMT to help improve heart function. Start metoprolol succinate 25 mg once daily and losartan 25 mg once daily.  Hold medications for BP<90 mm Hg SBP.  Schedule follow-up in 1 month with MD/APP.

## 2023-03-01 ENCOUNTER — Encounter: Payer: Self-pay | Admitting: Internal Medicine

## 2023-03-30 ENCOUNTER — Ambulatory Visit: Payer: BC Managed Care – PPO | Attending: Internal Medicine | Admitting: Internal Medicine

## 2023-03-30 ENCOUNTER — Encounter: Payer: Self-pay | Admitting: Internal Medicine

## 2023-03-30 VITALS — BP 126/74 | HR 82 | Ht 65.0 in | Wt 202.2 lb

## 2023-03-30 DIAGNOSIS — I429 Cardiomyopathy, unspecified: Secondary | ICD-10-CM | POA: Diagnosis not present

## 2023-03-30 DIAGNOSIS — I5022 Chronic systolic (congestive) heart failure: Secondary | ICD-10-CM

## 2023-03-30 DIAGNOSIS — I10 Essential (primary) hypertension: Secondary | ICD-10-CM

## 2023-03-30 MED ORDER — ENTRESTO 24-26 MG PO TABS: 1.0000 | ORAL_TABLET | Freq: Two times a day (BID) | ORAL | 1 refills | Status: DC

## 2023-03-30 MED ORDER — LOSARTAN POTASSIUM 25 MG PO TABS: 25.0000 mg | ORAL_TABLET | Freq: Every day | ORAL | Status: DC

## 2023-03-30 NOTE — Progress Notes (Signed)
Cardiology Office Note  Date: 03/30/2023   ID: Jillian Holt, Macintosh 12-Dec-1965, MRN [id number]  PCP:  Estanislado Pandy, MD  Cardiologist:  Marjo Bicker, MD Electrophysiologist:  None    History of Present Illness:   Overall doing great, chest pain is completely resolved.  No DOE, orthopnea, PND.  Has mild SOB but not overt DOE.  No dizziness, presyncope and syncope.  No palpitations or leg swelling.  Checks blood pressures at home but immediately after exertion which has been the range of 130 and 140 mmHg.  She did have an episode of chest pressure but she was gardening on a hot sunny day when she also felt SOB.  She did not have any recurrence since then.  Past Medical History:  Diagnosis Date   Dysfunction of left eustachian tube    GERD (gastroesophageal reflux disease)    Other fatigue    Palpitations     Past Surgical History:  Procedure Laterality Date   c section     CHOLECYSTECTOMY      Current Outpatient Medications  Medication Sig Dispense Refill   ascorbic acid (VITAMIN C) 500 MG tablet Take 500 mg by mouth daily.     cetirizine (ZYRTEC) 10 MG tablet Take 10 mg by mouth daily.     cholecalciferol (VITAMIN D3) 25 MCG (1000 UNIT) tablet Take 3,000 Units by mouth daily.     citalopram (CELEXA) 20 MG tablet Take 20 mg by mouth daily.     cyanocobalamin 100 MCG tablet Take 100 mcg by mouth. As needed     fluticasone (FLONASE) 50 MCG/ACT nasal spray Place 2 sprays into both nostrils daily.     Menaquinone-7 (VITAMIN K2) 100 MCG CAPS Take 1 capsule by mouth daily.     metoprolol succinate (TOPROL XL) 25 MG 24 hr tablet Take 1 tablet (25 mg total) by mouth daily. 90 tablet 3   nitroGLYCERIN (NITROSTAT) 0.4 MG SL tablet Place 1 tablet (0.4 mg total) under the tongue every 5 (five) minutes x 3 doses as needed for chest pain (If no relief after 3rd dose, proceed to the ED or call 911). 25 tablet 2   omeprazole (PRILOSEC) 20 MG capsule Take 20 mg by mouth daily.      sacubitril-valsartan (ENTRESTO) 24-26 MG Take 1 tablet by mouth 2 (two) times daily. 180 tablet 1   Zinc Sulfate (ZINC 15 PO) Take 1 tablet by mouth once a week. As needed     losartan (COZAAR) 25 MG tablet Take 1 tablet (25 mg total) by mouth daily.     No current facility-administered medications for this visit.   Allergies:  Bactrim [sulfamethoxazole-trimethoprim], Erythromycin, and Penicillins   Social History: The patient  reports that she has never smoked. She has never used smokeless tobacco. She reports that she does not drink alcohol and does not use drugs.   Family History: The patient's family history is not on file. She was adopted.   ROS:  Please see the history of present illness. Otherwise, complete review of systems is positive for none  All other systems are reviewed and negative.   Physical Exam: VS:  BP 126/74   Pulse 82   Ht 5\' 5"  (1.651 m)   Wt 202 lb 3.2 oz (91.7 kg)   SpO2 97%   BMI 33.65 kg/m , BMI Body mass index is 33.65 kg/m.  Wt Readings from Last 3 Encounters:  03/30/23 202 lb 3.2 oz (91.7 kg)  12/19/22 203  lb 6.4 oz (92.3 kg)  01/30/19 193 lb 4.8 oz (87.7 kg)    General: Patient appears comfortable at rest. HEENT: Conjunctiva and lids normal, oropharynx clear with moist mucosa. Neck: Supple, no elevated JVP or carotid bruits, no thyromegaly. Lungs: Clear to auscultation, nonlabored breathing at rest. Cardiac: Regular rate and rhythm, no S3 or significant systolic murmur, no pericardial rub. Abdomen: Soft, nontender, no hepatomegaly, bowel sounds present, no guarding or rebound. Extremities: No pitting edema, distal pulses 2+. Skin: Warm and dry. Musculoskeletal: No kyphosis. Neuropsychiatric: Alert and oriented x3, affect grossly appropriate.  Recent Labwork: 12/29/2022: BUN 13; Creatinine, Ser 0.77; Potassium 4.4; Sodium 140  No results found for: "CHOL", "TRIG", "HDL", "CHOLHDL", "VLDL", "LDLCALC", "LDLDIRECT"    Assessment and  Plan:  Noncardiac chest pain: No interval chest pain.  Patient was initially referred to cardiology clinic for evaluation of chest pressure radiating to the left side of her neck, at rest and with exertion, lasting for up to minutes.  CT cardiac showed coronary calcium score of 39 (90th percentile for age and sex matched control) and mild TPV.  Follow-up with PCP for noncardiac chest pain.  In addition, she will benefit from statin therapy but she does not want to be on statin due to many side effects.  I instructed her to take over-the-counter red yeast rice capsules.  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  Patient already has 1 month supply of losartan, instructed her to switch to [facility] after 1 month.  Will repeat limited echocardiogram for improvement in LVEF.  She also has left bundle branch block on EKG which might be contributing to her reduced LVEF, unclear.  HTN, partially controlled: Home blood pressures range around 130 to 140 mmHg SBP.  However I did discuss the proper way to check blood pressure as she is checking immediately after exertion. Currently on metoprolol and losartan which will be switched to [facility], as stated above.  Instruct her to check her blood pressures in a.m. and p.m.  She started to eat healthy and exercise right.   I have spent a total of 30 minutes with patient reviewing chart, EKGs, labs and examining patient as well as establishing an assessment and plan that was discussed with the patient.  > 50% of time was spent in direct patient care.    Medication Adjustments/Labs and Tests Ordered: Current medicines are reviewed at length with the patient today.  Concerns regarding medicines are outlined above.   Tests Ordered: Orders Placed This Encounter  Procedures   ECHOCARDIOGRAM LIMITED    Medication Changes: Meds ordered this encounter   Medications   losartan (COZAAR) 25 MG tablet    Sig: Take 1 tablet (25 mg total) by mouth daily.   sacubitril-valsartan (ENTRESTO) 24-26 MG    Sig: Take 1 tablet by mouth 2 (two) times daily.    Dispense:  180 tablet    Refill:  1    03/30/2023-New    Disposition:  Follow up 3 months  Signed Marianita Botkin Verne Spurr, MD, 03/30/2023 1:21 PM    [facility] at [facility] [address] [place], [place], [place] [address]

## 2023-03-30 NOTE — Patient Instructions (Signed)
Medication Instructions:  Your physician has recommended you make the following change in your medication:  Stop taking Losartan once you finish current prescription recently picked up Then start Entresto 24-26 mg  twice daily Continue taking all other medications as prescribed  Labwork: None  Testing/Procedures: Your physician has requested that you have an echocardiogram. Echocardiography is a painless test that uses sound waves to create images of your heart. It provides your doctor with information about the size and shape of your heart and how well your heart's chambers and valves are working. This procedure takes approximately one hour. There are no restrictions for this procedure. Please do NOT wear cologne, perfume, aftershave, or lotions (deodorant is allowed). Please arrive 15 minutes prior to your appointment time.   Follow-Up: Your physician recommends that you schedule a follow-up appointment in: 3 month follow up  Any Other Special Instructions Will Be Listed Below (If Applicable).  If you need a refill on your cardiac medications before your next appointment, please call your pharmacy.

## 2023-05-23 ENCOUNTER — Other Ambulatory Visit: Payer: Self-pay | Admitting: Family Medicine

## 2023-05-23 DIAGNOSIS — Z1231 Encounter for screening mammogram for malignant neoplasm of breast: Secondary | ICD-10-CM

## 2023-05-30 ENCOUNTER — Ambulatory Visit: Payer: BC Managed Care – PPO | Attending: Internal Medicine

## 2023-05-30 DIAGNOSIS — I429 Cardiomyopathy, unspecified: Secondary | ICD-10-CM

## 2023-05-30 MED ORDER — PERFLUTREN LIPID MICROSPHERE
1.0000 mL | INTRAVENOUS | Status: AC | PRN
  Administered 2023-05-30: 3 mL via INTRAVENOUS

## 2023-05-31 LAB — ECHOCARDIOGRAM LIMITED
Area-P 1/2: 3.5 cm2
Calc EF: 43.2 %
Est EF: 45
S' Lateral: 3.9 cm
Single Plane A2C EF: 50.9 %
Single Plane A4C EF: 39.4 %

## 2023-06-13 ENCOUNTER — Ambulatory Visit
Admission: RE | Admit: 2023-06-13 | Discharge: 2023-06-13 | Disposition: A | Discharge status: Home / Self Care | Payer: BC Managed Care – PPO | Source: Ambulatory Visit | Attending: Family Medicine | Admitting: Family Medicine

## 2023-06-13 DIAGNOSIS — Z1231 Encounter for screening mammogram for malignant neoplasm of breast: Secondary | ICD-10-CM

## 2023-07-03 ENCOUNTER — Ambulatory Visit: Payer: BC Managed Care – PPO | Attending: Internal Medicine | Admitting: Internal Medicine

## 2023-07-03 VITALS — BP 114/70 | HR 62 | Ht 66.0 in | Wt 187.8 lb

## 2023-07-03 DIAGNOSIS — I10 Essential (primary) hypertension: Secondary | ICD-10-CM

## 2023-07-03 DIAGNOSIS — R002 Palpitations: Secondary | ICD-10-CM

## 2023-07-03 NOTE — Progress Notes (Signed)
Cardiology Office Note  Date: 07/03/2023   ID: Jillian Holt, Jillian Holt Jul 04, 1966, MRN [id number]  PCP:  Estanislado Pandy, MD  Cardiologist:  Marjo Bicker, MD Electrophysiologist:  None    History of Present Illness:  Repeat echocardiogram showed LVEF 45%, similar to prior echo.  No more chest pains.  She continues to have dizziness and unsteady gait, checked her blood pressure and heart rate at that time and was noted to be around 100s millimeters mercury SBP and HR around 70 bpm.  The lowest HR was 56 bpm.  She is also under a lot of stress recently in the last few weeks as her mother fell and had a fracture.  No DOE, orthopnea, PND, palpitations, leg swelling.  Past Medical History:  Diagnosis Date   Dysfunction of left eustachian tube    GERD (gastroesophageal reflux disease)    Other fatigue    Palpitations     Past Surgical History:  Procedure Laterality Date   BREAST BIOPSY Left    c section     CHOLECYSTECTOMY      Current Outpatient Medications  Medication Sig Dispense Refill   ascorbic acid (VITAMIN C) 500 MG tablet Take 500 mg by mouth daily.     cetirizine (ZYRTEC) 10 MG tablet Take 10 mg by mouth daily.     cholecalciferol (VITAMIN D3) 25 MCG (1000 UNIT) tablet Take 3,000 Units by mouth daily.     citalopram (CELEXA) 20 MG tablet Take 20 mg by mouth daily.     cyanocobalamin 100 MCG tablet Take 100 mcg by mouth. As needed     fluticasone (FLONASE) 50 MCG/ACT nasal spray Place 2 sprays into both nostrils daily.     losartan (COZAAR) 25 MG tablet Take 1 tablet (25 mg total) by mouth daily.     Menaquinone-7 (VITAMIN K2) 100 MCG CAPS Take 1 capsule by mouth daily.     metoprolol succinate (TOPROL XL) 25 MG 24 hr tablet Take 1 tablet (25 mg total) by mouth daily. 90 tablet 3   nitroGLYCERIN (NITROSTAT) 0.4 MG SL tablet Place 1 tablet (0.4 mg total) under the tongue every 5 (five) minutes x 3 doses as needed for chest pain (If no relief after 3rd dose, proceed to  the ED or call 911). 25 tablet 2   omeprazole (PRILOSEC) 20 MG capsule Take 20 mg by mouth daily.     sacubitril-valsartan (ENTRESTO) 24-26 MG Take 1 tablet by mouth 2 (two) times daily. 180 tablet 1   Zinc Sulfate (ZINC 15 PO) Take 1 tablet by mouth once a week. As needed     No current facility-administered medications for this visit.   Allergies:  Bactrim [sulfamethoxazole-trimethoprim], Erythromycin, and Penicillins   Social History: The patient  reports that she has never smoked. She has never used smokeless tobacco. She reports that she does not drink alcohol and does not use drugs.   Family History: The patient's family history is not on file. She was adopted.   ROS:  Please see the history of present illness. Otherwise, complete review of systems is positive for none  All other systems are reviewed and negative.   Physical Exam: VS:  There were no vitals taken for this visit., BMI There is no height or weight on file to calculate BMI.  Wt Readings from Last 3 Encounters:  03/30/23 202 lb 3.2 oz (91.7 kg)  12/19/22 203 lb 6.4 oz (92.3 kg)  01/30/19 193 lb 4.8 oz (87.7 kg)  General: Patient appears comfortable at rest. HEENT: Conjunctiva and lids normal, oropharynx clear with moist mucosa. Neck: Supple, no elevated JVP or carotid bruits, no thyromegaly. Lungs: Clear to auscultation, nonlabored breathing at rest. Cardiac: Regular rate and rhythm, no S3 or significant systolic murmur, no pericardial rub. Abdomen: Soft, nontender, no hepatomegaly, bowel sounds present, no guarding or rebound. Extremities: No pitting edema, distal pulses 2+. Skin: Warm and dry. Musculoskeletal: No kyphosis. Neuropsychiatric: Alert and oriented x3, affect grossly appropriate.  Recent Labwork: 12/29/2022: BUN 13; Creatinine, Ser 0.77; Potassium 4.4; Sodium 140  No results found for: "CHOL", "TRIG", "HDL", "CHOLHDL", "VLDL", "LDLCALC", "LDLDIRECT"    Assessment and Plan:  New onset  cardiomyopathy LVEF 45%, likely nonischemic: CT cardiac showed coronary calcium score of 39 and mild TPV.  Continue metoprolol succinate 25 mg once daily and Entresto 24-26 mg twice daily.  Repeat echocardiogram showed LVEF 45%, similar to prior echo.  Continue current medications, currently asymptomatic except for dizziness which I instructed her to monitor.  She is under a lot of stress/anxiety which might be contributing to her dizziness.  EKG today showed NSR, IVCD.  Noncardiac chest pain: No interval chest pain.  Follow-up with PCP for noncardiac causes of chest pain. CT cardiac showed coronary calcium score of 39 (90th percentile for age and sex matched control) and mild TPV.  Follow-up with PCP for noncardiac chest pain.  In addition, she will benefit from statin therapy but she does not want to be on statin due to many side effects.  I instructed her to take over-the-counter red yeast rice capsules  HTN, controlled: Continue above medications.   Medication Adjustments/Labs and Tests Ordered: Current medicines are reviewed at length with the patient today.  Concerns regarding medicines are outlined above.   Tests Ordered: Orders Placed This Encounter  Procedures   EKG 12-Lead    Medication Changes: No orders of the defined types were placed in this encounter.   Disposition:  Follow up 6 months  Signed Cletus Paris Verne Spurr, MD, 07/03/2023 1:12 PM    [facility] at [facility] [address] [place], [place], [place] [address]

## 2023-07-03 NOTE — Patient Instructions (Addendum)

## 2023-09-12 ENCOUNTER — Other Ambulatory Visit: Payer: Self-pay | Admitting: Internal Medicine

## 2023-10-11 ENCOUNTER — Telehealth: Payer: Self-pay | Admitting: Internal Medicine

## 2023-10-11 NOTE — Telephone Encounter (Signed)
 Pt c/o medication issue:  1. Name of Medication:  metoprolol succinate (TOPROL XL) 25 MG 24 hr tablet     2. How are you currently taking this medication (dosage and times per day)?  25 mg, Daily      3. Are you having a reaction (difficulty breathing--STAT)? no  4. What is your medication issue? Pt has been taking her husband med by mistake, he takes metoprolol tartrate 25mg , no symptoms besides feeling more fatigue. Requesting cb to discuss

## 2023-10-11 NOTE — Telephone Encounter (Signed)
 Patient notified and verbalized understanding.

## 2024-01-09 ENCOUNTER — Ambulatory Visit: Payer: BC Managed Care – PPO | Admitting: Internal Medicine

## 2024-01-12 ENCOUNTER — Encounter: Payer: Self-pay | Admitting: Internal Medicine

## 2024-01-12 ENCOUNTER — Ambulatory Visit: Attending: Internal Medicine | Admitting: Internal Medicine

## 2024-01-12 VITALS — BP 104/60 | HR 78 | Ht 65.0 in | Wt 208.0 lb

## 2024-01-12 DIAGNOSIS — R002 Palpitations: Secondary | ICD-10-CM

## 2024-01-12 DIAGNOSIS — I5022 Chronic systolic (congestive) heart failure: Secondary | ICD-10-CM | POA: Diagnosis not present

## 2024-01-12 DIAGNOSIS — E7849 Other hyperlipidemia: Secondary | ICD-10-CM | POA: Diagnosis not present

## 2024-01-12 NOTE — Progress Notes (Unsigned)
 Cardiology Office Note  Date: 01/12/2024   ID: Sarea, Fyfe [birth year], MRN [id number]  PCP:  Atilano Deward ORN, MD  Cardiologist:  Diannah SHAUNNA Maywood, MD Electrophysiologist:  None    History of Present Illness:  Repeat echocardiogram showed LVEF 45%, similar to prior echo.   Patient is here for follow-up visit with me. No interval ER visits or hospitalizations. Patient denied any rest or exertional chest discomfort, tightness, heaviness or pressure, rest or exertional dyspnea, palpitations, light-headedness, syncope and LE swelling. Compliant with medications and no side-effects. Doing great overall.   Past Medical History:  Diagnosis Date   Dysfunction of left eustachian tube    GERD (gastroesophageal reflux disease)    Other fatigue    Palpitations     Past Surgical History:  Procedure Laterality Date   BREAST BIOPSY Left    c section     CHOLECYSTECTOMY      Current Outpatient Medications  Medication Sig Dispense Refill   ascorbic acid (VITAMIN C) 500 MG tablet Take 500 mg by mouth daily.     cetirizine (ZYRTEC) 10 MG tablet Take 10 mg by mouth daily.     cholecalciferol (VITAMIN D3) 25 MCG (1000 UNIT) tablet Take 3,000 Units by mouth daily.     citalopram (CELEXA) 20 MG tablet Take 20 mg by mouth daily.     cyanocobalamin 100 MCG tablet Take 100 mcg by mouth. As needed     ENTRESTO  24-26 MG TAKE 1 TABLET BY MOUTH TWICE A DAY 60 tablet 5   Flaxseed, Linseed, (FLAX SEED OIL PO) Take 1 capsule by mouth daily.     fluticasone (FLONASE) 50 MCG/ACT nasal spray Place 2 sprays into both nostrils daily.     MAGNESIUM PO Take by mouth once a week.     Menaquinone-7 (VITAMIN K2) 100 MCG CAPS Take 1 capsule by mouth daily.     metoprolol  succinate (TOPROL  XL) 25 MG 24 hr tablet Take 1 tablet (25 mg total) by mouth daily. 90 tablet 3   nitroGLYCERIN  (NITROSTAT ) 0.4 MG SL tablet Place 1 tablet (0.4 mg total) under the tongue every 5 (five) minutes x 3 doses as needed for  chest pain (If no relief after 3rd dose, proceed to the ED or call 911). 25 tablet 2   omeprazole (PRILOSEC) 20 MG capsule Take 20 mg by mouth daily.     Zinc Sulfate (ZINC 15 PO) Take 1 tablet by mouth once a week. As needed     No current facility-administered medications for this visit.   Allergies:  Bactrim [sulfamethoxazole-trimethoprim], Erythromycin, and Penicillins   Social History: The patient  reports that she has never smoked. She has never used smokeless tobacco. She reports that she does not drink alcohol and does not use drugs.   Family History: The patient's family history is not on file. She was adopted.   ROS:  Please see the history of present illness. Otherwise, complete review of systems is positive for none  All other systems are reviewed and negative.   Physical Exam: VS:  BP 104/60   Pulse 78   Ht 5' 5 (1.651 m)   Wt 208 lb (94.3 kg)   SpO2 96%   BMI 34.61 kg/m , BMI Body mass index is 34.61 kg/m.  Wt Readings from Last 3 Encounters:  01/12/24 208 lb (94.3 kg)  07/03/23 187 lb 12.8 oz (85.2 kg)  03/30/23 202 lb 3.2 oz (91.7 kg)    General: Patient appears  comfortable at rest. HEENT: Conjunctiva and lids normal, oropharynx clear with moist mucosa. Neck: Supple, no elevated JVP or carotid bruits, no thyromegaly. Lungs: Clear to auscultation, nonlabored breathing at rest. Cardiac: Regular rate and rhythm, no S3 or significant systolic murmur, no pericardial rub. Abdomen: Soft, nontender, no hepatomegaly, bowel sounds present, no guarding or rebound. Extremities: No pitting edema, distal pulses 2+. Skin: Warm and dry. Musculoskeletal: No kyphosis. Neuropsychiatric: Alert and oriented x3, affect grossly appropriate.  Recent Labwork: No results found for requested labs within last 365 days.  No results found for: CHOL, TRIG, HDL, CHOLHDL, VLDL, LDLCALC, LDLDIRECT    Assessment and Plan:  New onset cardiomyopathy LVEF 45%, nonischemic: No  angina or DOE. Doing great. CT cardiac showed coronary calcium  score of 39 and mild TPV.  Continue metoprolol  succinate 25 mg once daily, Entresto  24-26 mg twice daily.  Repeat echocardiogram showed LVEF 45% similar to prior echo.  She does not have any symptoms now, will repeat limited echocardiogram in 6 months.  GDMT up titration limited by soft blood pressures.  EKG today showed NSR, LBBB.  HLD, unknown values: Total plaque volume is mild.  Goal LDL less than 100.  Not on statin.  Can follow with PCP.  HTN, controlled: Continue above medications.   Medication Adjustments/Labs and Tests Ordered: Current medicines are reviewed at length with the patient today.  Concerns regarding medicines are outlined above.   Tests Ordered: Orders Placed This Encounter  Procedures   EKG 12-Lead    Medication Changes: No orders of the defined types were placed in this encounter.   Disposition:  Follow up 6 months  Signed Elyon Zoll Priya Jabari Swoveland, MD, 01/12/2024 1:48 PM    [facility] at [facility] [address] [place], [place], [place] [address]

## 2024-01-12 NOTE — Patient Instructions (Signed)
 Medication Instructions:  Your physician recommends that you continue on your current medications as directed. Please refer to the Current Medication list given to you today.  *If you need a refill on your cardiac medications before your next appointment, please call your pharmacy*  Lab Work: None If you have labs (blood work) drawn today and your tests are completely normal, you will receive your results only by: MyChart Message (if you have MyChart) OR A paper copy in the mail If you have any lab test that is abnormal or we need to change your treatment, we will call you to review the results.  Testing/Procedures: Your physician has requested that you have an echocardiogram. Echocardiography is a painless test that uses sound waves to create images of your heart. It provides your doctor with information about the size and shape of your heart and how well your heart's chambers and valves are working. This procedure takes approximately one hour. There are no restrictions for this procedure. Please do NOT wear cologne, perfume, aftershave, or lotions (deodorant is allowed). Please arrive 15 minutes prior to your appointment time.  Please note: We ask at that you not bring children with you during ultrasound (echo/ vascular) testing. Due to room size and safety concerns, children are not allowed in the ultrasound rooms during exams. Our front office staff cannot provide observation of children in our lobby area while testing is being conducted. An adult accompanying a patient to their appointment will only be allowed in the ultrasound room at the discretion of the ultrasound technician under special circumstances. We apologize for any inconvenience.   Follow-Up: At [facility], you and your health needs are our priority.  As part of our continuing mission to provide you with exceptional heart care, our providers are all part of one team.  This team includes your primary Cardiologist  (physician) and Advanced Practice Providers or APPs (Physician Assistants and Nurse Practitioners) who all work together to provide you with the care you need, when you need it.  Your next appointment:   6 month(s)  Provider:   You may see Vishnu P Mallipeddi, MD or one of the following Advanced Practice Providers on your designated Care Team:   [place], PA-C  [place], [place] Olivia Pavy, [place]     We recommend signing up for the patient portal called MyChart.  Sign up information is provided on this After Visit Summary.  MyChart is used to connect with patients for Virtual Visits (Telemedicine).  Patients are able to view lab/test results, encounter notes, upcoming appointments, etc.  Non-urgent messages can be sent to your provider as well.   To learn more about what you can do with MyChart, go to [email or web address].   Other Instructions

## 2024-01-16 DIAGNOSIS — E785 Hyperlipidemia, unspecified: Secondary | ICD-10-CM | POA: Insufficient documentation

## 2024-02-06 ENCOUNTER — Other Ambulatory Visit (HOSPITAL_COMMUNITY)

## 2024-02-09 ENCOUNTER — Telehealth: Payer: Self-pay | Admitting: Internal Medicine

## 2024-02-09 NOTE — Telephone Encounter (Signed)
 Pt c/o medication issue:  1. Name of Medication: citalopram (CELEXA) 20 MG tablet   2. How are you currently taking this medication (dosage and times per day)? Take 20 mg by mouth daily.   3. Are you having a reaction (difficulty breathing--STAT)? No  4. What is your medication issue? Pt is wondering if citalopram will interfere with the 2 new medications she was just prescribed by her other doctor. She was diagnosed with diverticulitis.

## 2024-02-09 NOTE — Telephone Encounter (Signed)
 Pt states that pharmacy told her when she picked up cipro and flagyl to contact cardiology regarding taking this with Celexa. Encouraged pt to reach out to ordering doctor.

## 2024-02-12 NOTE — Telephone Encounter (Signed)
 Can increase the risk of prolonged QTc. Her baseline is a little long. Would recommend she ask her provider if there is an alternative treatment, if not, then would recommend she come in for EKG after a 2 days. Will also send to Dr. Mallipeddi for her input.

## 2024-02-12 NOTE — Telephone Encounter (Signed)
 Left message to return call

## 2024-02-13 NOTE — Telephone Encounter (Signed)
Called pt. No answer. Left msg to return call.   

## 2024-02-17 ENCOUNTER — Other Ambulatory Visit: Payer: Self-pay | Admitting: Internal Medicine

## 2024-02-29 ENCOUNTER — Encounter (INDEPENDENT_AMBULATORY_CARE_PROVIDER_SITE_OTHER): Payer: Self-pay | Admitting: *Deleted

## 2024-03-14 ENCOUNTER — Telehealth (INDEPENDENT_AMBULATORY_CARE_PROVIDER_SITE_OTHER): Payer: Self-pay

## 2024-03-14 ENCOUNTER — Ambulatory Visit (INDEPENDENT_AMBULATORY_CARE_PROVIDER_SITE_OTHER): Admitting: Gastroenterology

## 2024-03-14 ENCOUNTER — Encounter (INDEPENDENT_AMBULATORY_CARE_PROVIDER_SITE_OTHER): Payer: Self-pay | Admitting: Gastroenterology

## 2024-03-14 VITALS — BP 113/73 | HR 71 | Temp 97.3°F | Ht 65.0 in | Wt 205.3 lb

## 2024-03-14 DIAGNOSIS — R1030 Lower abdominal pain, unspecified: Secondary | ICD-10-CM | POA: Diagnosis not present

## 2024-03-14 DIAGNOSIS — K224 Dyskinesia of esophagus: Secondary | ICD-10-CM | POA: Insufficient documentation

## 2024-03-14 DIAGNOSIS — R1310 Dysphagia, unspecified: Secondary | ICD-10-CM

## 2024-03-14 DIAGNOSIS — E669 Obesity, unspecified: Secondary | ICD-10-CM | POA: Diagnosis not present

## 2024-03-14 DIAGNOSIS — Z6834 Body mass index (BMI) 34.0-34.9, adult: Secondary | ICD-10-CM | POA: Diagnosis not present

## 2024-03-14 DIAGNOSIS — K50019 Crohn's disease of small intestine with unspecified complications: Secondary | ICD-10-CM

## 2024-03-14 DIAGNOSIS — E6609 Other obesity due to excess calories: Secondary | ICD-10-CM

## 2024-03-14 DIAGNOSIS — K5000 Crohn's disease of small intestine without complications: Secondary | ICD-10-CM | POA: Insufficient documentation

## 2024-03-14 MED ORDER — PEG 3350-KCL-NA BICARB-NACL 420 G PO SOLR: 4000.0000 mL | Freq: Once | ORAL | 0 refills | Status: AC

## 2024-03-14 MED ORDER — HYOSCYAMINE SULFATE 0.125 MG SL SUBL: 0.1250 mg | SUBLINGUAL_TABLET | Freq: Four times a day (QID) | SUBLINGUAL | 3 refills | Status: AC | PRN

## 2024-03-14 NOTE — Progress Notes (Signed)
 Toribio Fortune, M.D. Gastroenterology & Hepatology [facility] Gastroenterology [address] [place], [place] [address] Primary Care Physician: Atilano Deward ORN, MD [address] [place] [address]  Referring MD: PCP  Chief Complaint:  abdominal pain  History of Present Illness: Jillian Holt is a 58 y.o. 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  Patient reports that for the last 4 months ago, she presented new onset of abdominal pain in the left side of her abdomen which was initially intermittent . However, the pain worsened and became constant. Due to this she was prescribed a week course of Flagyl and a second antibiotic by her PCP for presumed diverticulitis.  She also reported seeing blood in her stool once a week since 02/21/2024. Has one BM every, once a day  -stools are fluffy in consistency.  Patient was seen at [facility] on 02/22/2024, as he r PCP advised her to have urgent evaluation due to poor response to treatment. However given the persistence of pain she was seen in the hospital. CBC was within normal limits, CMP showed mild elevation of ALT of 51, AST mildly elevated 38, total bilirubin 0.3, alkaline phosphatase 98, normal renal function and electrolytes.  CT angio of the abdomen and pelvis was performed, report stated that there was presence of mild distal and terminal ileitis.  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  Patient reports feeling better since she went to the ER. States that the pain fluctuates intermittently and she feels weakness intermittently, also feeling similar as she will pass out. Pain is located in the LLQ and does not radiate - pain is 8/10 in severity. She reports that she has to be cautious with bending and leaning on her left side, to decrease her  abdominal pain. Occasionally has some bloating episodes.  She states that she has been taking ER oral iron supplementation - she started taking it on her own, has taking it as needed and makes her feel better in terms of her fatigue.  The patient denies having any nausea, vomiting, fever, chills, hematochezia, melena, hematemesis, abdominal distention, diarrhea, jaundice, pruritus. Has gained at least 5 lb despite not eating too much.Patient would like a referral to nutritionist for obesity.  Has been on omeprazole for multiple years, takes 20 mg qday. No heartburn. May have some occasional episodes of dysphagia, which has been attributed to jackhammer esophagus. She reports that she had a manometry in [facility] in 02/28/12 - per notes, manometry showed increased amplitude of peristaltic waves consistent with Jackhammer esophagus.  Esophageal pH (on PPI) Normal esophageal pH study (Note: patient had no symptoms during study.)  .  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 No reports available Last Colonoscopy: 05/28/2018, performed by Dr. Ivery at [facility] Normal colonoscopy  FHx: neg for any gastrointestinal/liver disease, no malignancies Social: neg smoking, alcohol or illicit drug use Surgical: complete hysterectomy, cholecystectomy, c-section  Past Medical History: Past Medical History:  Diagnosis Date   Dysfunction of left eustachian tube    GERD (gastroesophageal reflux disease)    Other fatigue    Palpitations     Past Surgical History: Past Surgical History:  Procedure Laterality Date   BREAST BIOPSY Left    c section     CHOLECYSTECTOMY  Family History: Family History  Adopted: Yes  Problem Relation Age of Onset   Breast cancer Neg Hx     Social History: Social History   Tobacco Use  Smoking Status Never  Smokeless Tobacco Never   Social History   Substance and Sexual  Activity  Alcohol Use Never   Social History   Substance and Sexual Activity  Drug Use Never    Allergies: Allergies  Allergen Reactions   Bactrim [Sulfamethoxazole-Trimethoprim]     Rash    Erythromycin     Cannot tolerate   Penicillins     rash    Medications: Current Outpatient Medications  Medication Sig Dispense Refill   ascorbic acid (VITAMIN C) 500 MG tablet Take 500 mg by mouth daily. (Patient taking differently: Take 500 mg by mouth daily. occasionally)     cetirizine (ZYRTEC) 10 MG tablet Take 10 mg by mouth daily.     cholecalciferol (VITAMIN D3) 25 MCG (1000 UNIT) tablet Take 3,000 Units by mouth daily.     citalopram (CELEXA) 20 MG tablet Take 20 mg by mouth daily. (Patient taking differently: Take 10 mg by mouth daily.)     cyanocobalamin 100 MCG tablet Take 100 mcg by mouth. As needed     ENTRESTO  24-26 MG TAKE 1 TABLET BY MOUTH TWICE A DAY 60 tablet 5   Flaxseed, Linseed, (FLAX SEED OIL PO) Take 1 capsule by mouth daily. (Patient taking differently: Take 1 capsule by mouth daily. Occasionally.)     fluticasone (FLONASE) 50 MCG/ACT nasal spray Place 2 sprays into both nostrils daily. (Patient taking differently: Place 2 sprays into both nostrils as needed.)     hyoscyamine  (LEVSIN  SL) 0.125 MG SL tablet Place 1 tablet (0.125 mg total) under the tongue every 6 (six) hours as needed (abdominal pain, pain when swallowing). 180 tablet 3   MAGNESIUM PO Take by mouth once a week.     Menaquinone-7 (VITAMIN K2) 100 MCG CAPS Take 1 capsule by mouth daily.     metoprolol  succinate (TOPROL -XL) 25 MG 24 hr tablet TAKE 1 TABLET (25 MG TOTAL) BY MOUTH DAILY. 90 tablet 3   nitroGLYCERIN  (NITROSTAT ) 0.4 MG SL tablet Place 1 tablet (0.4 mg total) under the tongue every 5 (five) minutes x 3 doses as needed for chest pain (If no relief after 3rd dose, proceed to the ED or call 911). 25 tablet 2   omeprazole (PRILOSEC) 20 MG capsule Take 20 mg by mouth daily.     Zinc Sulfate (ZINC  15 PO) Take 1 tablet by mouth once a week. As needed     No current facility-administered medications for this visit.    Review of Systems: GENERAL: negative for malaise, night sweats HEENT: No changes in hearing or vision, no nose bleeds or other nasal problems. NECK: Negative for lumps, goiter, pain and significant neck swelling RESPIRATORY: Negative for cough, wheezing CARDIOVASCULAR: Negative for chest pain, leg swelling, palpitations, orthopnea GI: SEE HPI MUSCULOSKELETAL: Negative for joint pain or swelling, back pain, and muscle pain. SKIN: Negative for lesions, rash PSYCH: Negative for sleep disturbance, mood disorder and recent psychosocial stressors. HEMATOLOGY Negative for prolonged bleeding, bruising easily, and swollen nodes. ENDOCRINE: Negative for cold or heat intolerance, polyuria, polydipsia and goiter. NEURO: negative for tremor, gait imbalance, syncope and seizures. The remainder of the review of systems is noncontributory.   Physical Exam: BP 113/73 (BP Location: Left Arm, Patient Position: Sitting, Cuff Size: Large)   Pulse 71   Temp (!) 97.3  F (36.3 C) (Temporal)   Ht 5' 5 (1.651 m)   Wt 205 lb 4.8 oz (93.1 kg)   LMP 01/09/2019   BMI 34.16 kg/m  GENERAL: The patient is AO x3, in no acute distress. HEENT: Head is normocephalic and atraumatic. EOMI are intact. Mouth is well hydrated and without lesions. NECK: Supple. No masses LUNGS: Clear to auscultation. No presence of rhonchi/wheezing/rales. Adequate chest expansion HEART: RRR, normal s1 and s2. ABDOMEN: Tender upon palpation of the lower abdomen , no guarding, no peritoneal signs, and nondistended. BS +. No masses. EXTREMITIES: Without any cyanosis, clubbing, rash, lesions or edema. NEUROLOGIC: AOx3, no focal motor deficit. SKIN: no jaundice, no rashes   Imaging/Labs: as above  I personally reviewed and interpreted the available labs, imaging and endoscopic files.  Impression and Plan: Jillian Holt is a 58 y.o. 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  Patient has presented some episodes of abdominal pain in her lower abdomen.  Most recent imaging showed presence of inflammation of the terminal ileum without associated diarrhea.  We discussed the importance of evaluating this further with a colonoscopy to rule out inflammatory causes of her symptoms.  She is agreeable to proceed with this.  For now, she will be provided symptom relief with Levsin  as needed.  In terms of her dysphagia episodes, she had a previous manometry showing changes concerning for jackhammer esophagus.  No GERD symptoms.  We discussed that as her symptoms have worsened recently, we will rule out endoluminal etiologies with EGD.  She can take Levsin  as well for the episodes of chest pain with swallowing as this is part of the management of jackhammer esophagus.  Regarding her obesity, she is interested to have nutritional management and counseling, so I will refer her to nutritionist.  -Schedule EGD and colonoscopy -Referral to nutritionist -Start Levsin  1 tablet q6h as needed for abdominal pain or difficulty swallowing/chest pain when swallowing  All questions were answered.      Toribio Fortune, MD Gastroenterology and Hepatology [facility]enter[facility]

## 2024-03-14 NOTE — Patient Instructions (Addendum)
 Schedule EGD and colonoscopy Referral to nutritionist Start Levsin  1 tablet q6h as needed for abdominal pain or difficulty swallowing/chest pain when swallowing

## 2024-03-14 NOTE — H&P (View-Only) (Signed)
 Toribio Fortune, M.D. Gastroenterology & Hepatology [facility] Gastroenterology [address] [place], [place] [address] Primary Care Physician: Atilano Deward ORN, MD [address] [place] [address]  Referring MD: PCP  Chief Complaint:  abdominal pain  History of Present Illness: Amirra Herling is a 58 y.o. 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  Patient reports that for the last 4 months ago, she presented new onset of abdominal pain in the left side of her abdomen which was initially intermittent . However, the pain worsened and became constant. Due to this she was prescribed a week course of Flagyl and a second antibiotic by her PCP for presumed diverticulitis.  She also reported seeing blood in her stool once a week since 02/21/2024. Has one BM every, once a day  -stools are fluffy in consistency.  Patient was seen at [facility] on 02/22/2024, as he r PCP advised her to have urgent evaluation due to poor response to treatment. However given the persistence of pain she was seen in the hospital. CBC was within normal limits, CMP showed mild elevation of ALT of 51, AST mildly elevated 38, total bilirubin 0.3, alkaline phosphatase 98, normal renal function and electrolytes.  CT angio of the abdomen and pelvis was performed, report stated that there was presence of mild distal and terminal ileitis.  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  Patient reports feeling better since she went to the ER. States that the pain fluctuates intermittently and she feels weakness intermittently, also feeling similar as she will pass out. Pain is located in the LLQ and does not radiate - pain is 8/10 in severity. She reports that she has to be cautious with bending and leaning on her left side, to decrease her  abdominal pain. Occasionally has some bloating episodes.  She states that she has been taking ER oral iron supplementation - she started taking it on her own, has taking it as needed and makes her feel better in terms of her fatigue.  The patient denies having any nausea, vomiting, fever, chills, hematochezia, melena, hematemesis, abdominal distention, diarrhea, jaundice, pruritus. Has gained at least 5 lb despite not eating too much.Patient would like a referral to nutritionist for obesity.  Has been on omeprazole for multiple years, takes 20 mg qday. No heartburn. May have some occasional episodes of dysphagia, which has been attributed to jackhammer esophagus. She reports that she had a manometry in [facility] in 02/28/12 - per notes, manometry showed increased amplitude of peristaltic waves consistent with Jackhammer esophagus.  Esophageal pH (on PPI) Normal esophageal pH study (Note: patient had no symptoms during study.)  .  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 No reports available Last Colonoscopy: 05/28/2018, performed by Dr. Ivery at [facility] Normal colonoscopy  FHx: neg for any gastrointestinal/liver disease, no malignancies Social: neg smoking, alcohol or illicit drug use Surgical: complete hysterectomy, cholecystectomy, c-section  Past Medical History: Past Medical History:  Diagnosis Date   Dysfunction of left eustachian tube    GERD (gastroesophageal reflux disease)    Other fatigue    Palpitations     Past Surgical History: Past Surgical History:  Procedure Laterality Date   BREAST BIOPSY Left    c section     CHOLECYSTECTOMY  Family History: Family History  Adopted: Yes  Problem Relation Age of Onset   Breast cancer Neg Hx     Social History: Social History   Tobacco Use  Smoking Status Never  Smokeless Tobacco Never   Social History   Substance and Sexual  Activity  Alcohol Use Never   Social History   Substance and Sexual Activity  Drug Use Never    Allergies: Allergies  Allergen Reactions   Bactrim [Sulfamethoxazole-Trimethoprim]     Rash    Erythromycin     Cannot tolerate   Penicillins     rash    Medications: Current Outpatient Medications  Medication Sig Dispense Refill   ascorbic acid (VITAMIN C) 500 MG tablet Take 500 mg by mouth daily. (Patient taking differently: Take 500 mg by mouth daily. occasionally)     cetirizine (ZYRTEC) 10 MG tablet Take 10 mg by mouth daily.     cholecalciferol (VITAMIN D3) 25 MCG (1000 UNIT) tablet Take 3,000 Units by mouth daily.     citalopram (CELEXA) 20 MG tablet Take 20 mg by mouth daily. (Patient taking differently: Take 10 mg by mouth daily.)     cyanocobalamin 100 MCG tablet Take 100 mcg by mouth. As needed     ENTRESTO  24-26 MG TAKE 1 TABLET BY MOUTH TWICE A DAY 60 tablet 5   Flaxseed, Linseed, (FLAX SEED OIL PO) Take 1 capsule by mouth daily. (Patient taking differently: Take 1 capsule by mouth daily. Occasionally.)     fluticasone (FLONASE) 50 MCG/ACT nasal spray Place 2 sprays into both nostrils daily. (Patient taking differently: Place 2 sprays into both nostrils as needed.)     hyoscyamine  (LEVSIN  SL) 0.125 MG SL tablet Place 1 tablet (0.125 mg total) under the tongue every 6 (six) hours as needed (abdominal pain, pain when swallowing). 180 tablet 3   MAGNESIUM PO Take by mouth once a week.     Menaquinone-7 (VITAMIN K2) 100 MCG CAPS Take 1 capsule by mouth daily.     metoprolol  succinate (TOPROL -XL) 25 MG 24 hr tablet TAKE 1 TABLET (25 MG TOTAL) BY MOUTH DAILY. 90 tablet 3   nitroGLYCERIN  (NITROSTAT ) 0.4 MG SL tablet Place 1 tablet (0.4 mg total) under the tongue every 5 (five) minutes x 3 doses as needed for chest pain (If no relief after 3rd dose, proceed to the ED or call 911). 25 tablet 2   omeprazole (PRILOSEC) 20 MG capsule Take 20 mg by mouth daily.     Zinc Sulfate (ZINC  15 PO) Take 1 tablet by mouth once a week. As needed     No current facility-administered medications for this visit.    Review of Systems: GENERAL: negative for malaise, night sweats HEENT: No changes in hearing or vision, no nose bleeds or other nasal problems. NECK: Negative for lumps, goiter, pain and significant neck swelling RESPIRATORY: Negative for cough, wheezing CARDIOVASCULAR: Negative for chest pain, leg swelling, palpitations, orthopnea GI: SEE HPI MUSCULOSKELETAL: Negative for joint pain or swelling, back pain, and muscle pain. SKIN: Negative for lesions, rash PSYCH: Negative for sleep disturbance, mood disorder and recent psychosocial stressors. HEMATOLOGY Negative for prolonged bleeding, bruising easily, and swollen nodes. ENDOCRINE: Negative for cold or heat intolerance, polyuria, polydipsia and goiter. NEURO: negative for tremor, gait imbalance, syncope and seizures. The remainder of the review of systems is noncontributory.   Physical Exam: BP 113/73 (BP Location: Left Arm, Patient Position: Sitting, Cuff Size: Large)   Pulse 71   Temp (!) 97.3  F (36.3 C) (Temporal)   Ht 5' 5 (1.651 m)   Wt 205 lb 4.8 oz (93.1 kg)   LMP 01/09/2019   BMI 34.16 kg/m  GENERAL: The patient is AO x3, in no acute distress. HEENT: Head is normocephalic and atraumatic. EOMI are intact. Mouth is well hydrated and without lesions. NECK: Supple. No masses LUNGS: Clear to auscultation. No presence of rhonchi/wheezing/rales. Adequate chest expansion HEART: RRR, normal s1 and s2. ABDOMEN: Tender upon palpation of the lower abdomen , no guarding, no peritoneal signs, and nondistended. BS +. No masses. EXTREMITIES: Without any cyanosis, clubbing, rash, lesions or edema. NEUROLOGIC: AOx3, no focal motor deficit. SKIN: no jaundice, no rashes   Imaging/Labs: as above  I personally reviewed and interpreted the available labs, imaging and endoscopic files.  Impression and Plan: Alixandria Friedt is a 58 y.o. 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  Patient has presented some episodes of abdominal pain in her lower abdomen.  Most recent imaging showed presence of inflammation of the terminal ileum without associated diarrhea.  We discussed the importance of evaluating this further with a colonoscopy to rule out inflammatory causes of her symptoms.  She is agreeable to proceed with this.  For now, she will be provided symptom relief with Levsin  as needed.  In terms of her dysphagia episodes, she had a previous manometry showing changes concerning for jackhammer esophagus.  No GERD symptoms.  We discussed that as her symptoms have worsened recently, we will rule out endoluminal etiologies with EGD.  She can take Levsin  as well for the episodes of chest pain with swallowing as this is part of the management of jackhammer esophagus.  Regarding her obesity, she is interested to have nutritional management and counseling, so I will refer her to nutritionist.  -Schedule EGD and colonoscopy -Referral to nutritionist -Start Levsin  1 tablet q6h as needed for abdominal pain or difficulty swallowing/chest pain when swallowing  All questions were answered.      Toribio Fortune, MD Gastroenterology and Hepatology [facility]

## 2024-03-14 NOTE — Telephone Encounter (Signed)
 Spoke with pt, scheduled procedures for 03/26/2024 at 8:30. Will send rx to pharmacy and instructions to mychart along with her pre-op appt. Per [facility] no PA required.

## 2024-03-15 NOTE — Telephone Encounter (Signed)
 Spoke with pt, gave her pre-op appt for 9/5 at 2:45pm.

## 2024-03-18 ENCOUNTER — Other Ambulatory Visit: Payer: Self-pay | Admitting: Internal Medicine

## 2024-03-19 ENCOUNTER — Encounter (INDEPENDENT_AMBULATORY_CARE_PROVIDER_SITE_OTHER): Payer: Self-pay | Admitting: Gastroenterology

## 2024-03-19 ENCOUNTER — Encounter: Payer: Self-pay | Admitting: Internal Medicine

## 2024-03-20 MED ORDER — ENTRESTO 24-26 MG PO TABS: 1.0000 | ORAL_TABLET | Freq: Two times a day (BID) | ORAL | 9 refills | Status: DC

## 2024-03-22 ENCOUNTER — Encounter (HOSPITAL_COMMUNITY): Payer: Self-pay

## 2024-03-22 ENCOUNTER — Encounter (HOSPITAL_COMMUNITY)
Admission: RE | Admit: 2024-03-22 | Discharge: 2024-03-22 | Disposition: A | Discharge status: Home / Self Care | Source: Ambulatory Visit | Attending: Gastroenterology | Admitting: Gastroenterology

## 2024-03-25 LAB — HM COLONOSCOPY

## 2024-03-26 ENCOUNTER — Other Ambulatory Visit (INDEPENDENT_AMBULATORY_CARE_PROVIDER_SITE_OTHER): Payer: Self-pay | Admitting: Gastroenterology

## 2024-03-26 ENCOUNTER — Ambulatory Visit (HOSPITAL_COMMUNITY)
Admission: RE | Admit: 2024-03-26 | Discharge: 2024-03-26 | Disposition: A | Discharge status: Home / Self Care | Attending: Gastroenterology | Admitting: Gastroenterology

## 2024-03-26 ENCOUNTER — Ambulatory Visit (HOSPITAL_COMMUNITY)

## 2024-03-26 ENCOUNTER — Encounter (HOSPITAL_COMMUNITY)
Admission: RE | Disposition: A | Discharge status: Home / Self Care | Payer: Self-pay | Source: Home / Self Care | Attending: Gastroenterology

## 2024-03-26 ENCOUNTER — Other Ambulatory Visit: Payer: Self-pay

## 2024-03-26 ENCOUNTER — Encounter (HOSPITAL_COMMUNITY): Payer: Self-pay | Admitting: Gastroenterology

## 2024-03-26 ENCOUNTER — Encounter (INDEPENDENT_AMBULATORY_CARE_PROVIDER_SITE_OTHER): Payer: Self-pay

## 2024-03-26 DIAGNOSIS — R109 Unspecified abdominal pain: Secondary | ICD-10-CM | POA: Diagnosis present

## 2024-03-26 DIAGNOSIS — K529 Noninfective gastroenteritis and colitis, unspecified: Secondary | ICD-10-CM

## 2024-03-26 DIAGNOSIS — I110 Hypertensive heart disease with heart failure: Secondary | ICD-10-CM | POA: Insufficient documentation

## 2024-03-26 DIAGNOSIS — R1310 Dysphagia, unspecified: Secondary | ICD-10-CM | POA: Insufficient documentation

## 2024-03-26 DIAGNOSIS — D122 Benign neoplasm of ascending colon: Secondary | ICD-10-CM | POA: Insufficient documentation

## 2024-03-26 DIAGNOSIS — K648 Other hemorrhoids: Secondary | ICD-10-CM | POA: Insufficient documentation

## 2024-03-26 DIAGNOSIS — K219 Gastro-esophageal reflux disease without esophagitis: Secondary | ICD-10-CM | POA: Insufficient documentation

## 2024-03-26 DIAGNOSIS — I509 Heart failure, unspecified: Secondary | ICD-10-CM | POA: Insufficient documentation

## 2024-03-26 DIAGNOSIS — I447 Left bundle-branch block, unspecified: Secondary | ICD-10-CM | POA: Diagnosis not present

## 2024-03-26 DIAGNOSIS — K449 Diaphragmatic hernia without obstruction or gangrene: Secondary | ICD-10-CM | POA: Insufficient documentation

## 2024-03-26 DIAGNOSIS — K317 Polyp of stomach and duodenum: Secondary | ICD-10-CM | POA: Diagnosis not present

## 2024-03-26 HISTORY — PX: COLONOSCOPY: SHX5424

## 2024-03-26 HISTORY — PX: ESOPHAGOGASTRODUODENOSCOPY: SHX5428

## 2024-03-26 HISTORY — PX: ESOPHAGEAL DILATION: SHX303

## 2024-03-26 SURGERY — COLONOSCOPY
Anesthesia: General

## 2024-03-26 MED ORDER — LACTATED RINGERS IV SOLN: INTRAVENOUS | Status: DC

## 2024-03-26 MED ORDER — ONDANSETRON HCL 4 MG/2ML IJ SOLN
4.0000 mg | Freq: Once | INTRAMUSCULAR | Status: AC
Start: 2024-03-26 — End: 2024-03-26

## 2024-03-26 MED ORDER — OMEPRAZOLE 40 MG PO CPDR: 40.0000 mg | DELAYED_RELEASE_CAPSULE | Freq: Two times a day (BID) | ORAL | 0 refills | Status: DC

## 2024-03-26 MED ORDER — ONDANSETRON HCL 4 MG/2ML IJ SOLN
INTRAMUSCULAR | Status: AC
  Administered 2024-03-26: 4 mg via INTRAVENOUS
  Filled 2024-03-26: qty 2

## 2024-03-26 MED ORDER — METOPROLOL TARTRATE 5 MG/5ML IV SOLN
INTRAVENOUS | Status: DC | PRN
  Administered 2024-03-26: 1 mg via INTRAVENOUS

## 2024-03-26 MED ORDER — LIDOCAINE HCL (CARDIAC) PF 100 MG/5ML IV SOSY
PREFILLED_SYRINGE | INTRAVENOUS | Status: DC | PRN
  Administered 2024-03-26: 100 mg via INTRAVENOUS

## 2024-03-26 MED ORDER — PROPOFOL 10 MG/ML IV BOLUS
INTRAVENOUS | Status: DC | PRN
  Administered 2024-03-26 (×5): 50 mg via INTRAVENOUS
  Administered 2024-03-26: 100 mg via INTRAVENOUS
  Administered 2024-03-26 (×2): 50 mg via INTRAVENOUS
  Administered 2024-03-26: 100 mg via INTRAVENOUS
  Administered 2024-03-26: 50 mg via INTRAVENOUS

## 2024-03-26 NOTE — Op Note (Signed)
 [facility] Patient Name: Jillian Holt Procedure Date: 03/26/2024 8:16 AM MRN: [id number] Date of Birth: [birth year] Attending MD: Toribio Fortune , , [id number] CSN: [id number] Age: 58 Admit Type: Outpatient Procedure:                Upper GI endoscopy Indications:              Dysphagia - Jackhammer esophagus Providers:                Toribio Fortune, Harlene Lips, Daphne Mulch                            Technician, Technician Referring MD:              Medicines:                Monitored Anesthesia Care Complications:            No immediate complications. Estimated Blood Loss:     Estimated blood loss: none. Procedure:                Pre-Anesthesia Assessment:                           - Prior to the procedure, a History and Physical                            was performed, and patient medications, allergies                            and sensitivities were reviewed. The patient's                            tolerance of previous anesthesia was reviewed.                           - The risks and benefits of the procedure and the                            sedation options and risks were discussed with the                            patient. All questions were answered and informed                            consent was obtained.                           - ASA Grade Assessment: II - A patient with mild                            systemic disease.                           After obtaining informed consent, the endoscope was                            passed under direct vision. Throughout the  procedure, the patient's blood pressure, pulse, and                            oxygen saturations were monitored continuously. The                            [id number] ([id number]) Upper was introduced through                            the mouth, and advanced to the second part of                            duodenum. The upper GI endoscopy was accomplished                             without difficulty. The patient tolerated the                            procedure well. Scope In: 8:30:12 AM Scope Out: 8:44:25 AM Total Procedure Duration: 0 hours 14 minutes 13 seconds  Findings:      No endoscopic abnormality was evident in the esophagus to explain the       patient's complaint of dysphagia. It was decided, however, to proceed       with dilation of the entire esophagus. A guidewire was placed and the       scope was withdrawn. Dilation was performed with a Savary dilator with       no resistance at 18 mm. The dilation site was examined and showed no       change. Biopsies were obtained from the proximal and distal esophagus       with cold forceps for histology of eosinophilic esophagitis.      A 1 cm hiatal hernia was present.      Multiple 5 to 15 mm semi-sessile polyps with no bleeding and stigmata of       recent bleeding were found in the gastric body. Two polyps were removed       with a cold snare. Resection and retrieval were complete.      The examined duodenum was normal. Impression:               - No endoscopic esophageal abnormality to explain                            patient's dysphagia. Esophagus dilated. Dilated.                           - 1 cm hiatal hernia.                           - Multiple gastric polyps. Resected and retrieved.                           - Normal examined duodenum.                           - Biopsies were taken with a cold forceps for  evaluation of eosinophilic esophagitis. Moderate Sedation:      Per Anesthesia Care Recommendation:           - Discharge patient to home (ambulatory).                           - Resume previous diet.                           - Await pathology results.                           - Use Prilosec (omeprazole ) 40 mg PO BID for 4                            weeks, then go back to 20 mg daily.                           - Continue with Levsin  as  needed for chest pain                            with swallowing. Procedure Code(s):        --- Professional ---                           [phone], Esophagogastroduodenoscopy, flexible,                            transoral; with removal of tumor(s), polyp(s), or                            other lesion(s) by snare technique                           43248, Esophagogastroduodenoscopy, flexible,                            transoral; with insertion of guide wire followed by                            passage of dilator(s) through esophagus over guide                            wire                           43239, 59, Esophagogastroduodenoscopy, flexible,                            transoral; with biopsy, single or multiple Diagnosis Code(s):        --- Professional ---                           R13.10, Dysphagia, unspecified                           K44.9, Diaphragmatic hernia without obstruction or  gangrene                           K31.7, Polyp of stomach and duodenum CPT copyright 2022 American Medical Association. All rights reserved. The codes documented in this report are preliminary and upon coder review may  be revised to meet current compliance requirements. Toribio Fortune, MD Toribio Fortune,  03/26/2024 9:14:32 AM This report has been signed electronically. Number of Addenda: 0

## 2024-03-26 NOTE — Transfer of Care (Signed)
 Immediate Anesthesia Transfer of Care Note  Patient: Jillian Holt  Procedure(s) Performed: COLONOSCOPY EGD (ESOPHAGOGASTRODUODENOSCOPY) DILATION, ESOPHAGUS  Patient Location: Short Stay  Anesthesia Type:General  Level of Consciousness: awake, alert , oriented, and patient cooperative  Airway & Oxygen Therapy: Patient Spontanous Breathing  Post-op Assessment: Report given to RN and Post -op Vital signs reviewed and stable  Post vital signs: Reviewed and stable  Last Vitals:  Vitals Value Taken Time  BP 90/58 03/26/24 09:12  Temp 36.8 C 03/26/24 09:12  Pulse 79 03/26/24 09:12  Resp 14 03/26/24 09:12  SpO2 100 % 03/26/24 09:12    Last Pain:  Vitals:   03/26/24 0912  TempSrc:   PainSc: 0-No pain      Patients Stated Pain Goal: 6 (03/26/24 0728)  Complications: No notable events documented.

## 2024-03-26 NOTE — Telephone Encounter (Signed)
 Will ask patient to take 2 OTC pills per day as insurance is not covering medication

## 2024-03-26 NOTE — Discharge Instructions (Signed)
 You are being discharged to home.  Resume your previous diet.  We are waiting for your pathology results.  Take Prilosec (omeprazole ) 40 mg by mouth twice a day for four weeks, then go back to 20 mg daily. Continue with Levsin  as needed for chest pain with swallowing. Your physician has recommended a repeat colonoscopy for surveillance based on pathology results.

## 2024-03-26 NOTE — Anesthesia Preprocedure Evaluation (Addendum)
 Anesthesia Evaluation  Patient identified by MRN, date of birth, ID band Patient awake    Reviewed: Allergy & Precautions, H&P , NPO status , Patient's Chart, lab work & pertinent test results  Airway Mallampati: II  TM Distance: >3 FB Neck ROM: Full    Dental no notable dental hx.    Pulmonary neg pulmonary ROS   Pulmonary exam normal breath sounds clear to auscultation       Cardiovascular hypertension, +CHF  Normal cardiovascular exam Rhythm:Regular Rate:Normal  LBBB EF 45%   Neuro/Psych negative neurological ROS  negative psych ROS   GI/Hepatic Neg liver ROS,GERD  ,,  Endo/Other  negative endocrine ROS    Renal/GU negative Renal ROS  negative genitourinary   Musculoskeletal negative musculoskeletal ROS (+)    Abdominal   Peds negative pediatric ROS (+)  Hematology negative hematology ROS (+)   Anesthesia Other Findings Pt took cardiac meds this am however she vomited twice this am  Reproductive/Obstetrics negative OB ROS                              Anesthesia Physical Anesthesia Plan  ASA: 3  Anesthesia Plan: General   Post-op Pain Management:    Induction: Intravenous  PONV Risk Score and Plan:   Airway Management Planned: Nasal Cannula  Additional Equipment:   Intra-op Plan:   Post-operative Plan:   Informed Consent: I have reviewed the patients History and Physical, chart, labs and discussed the procedure including the risks, benefits and alternatives for the proposed anesthesia with the patient or authorized representative who has indicated his/her understanding and acceptance.     Dental advisory given  Plan Discussed with: CRNA  Anesthesia Plan Comments:          Anesthesia Quick Evaluation

## 2024-03-26 NOTE — Anesthesia Postprocedure Evaluation (Signed)
 Anesthesia Post Note  Patient: Ronal [place]  Procedure(s) Performed: COLONOSCOPY EGD (ESOPHAGOGASTRODUODENOSCOPY) DILATION, ESOPHAGUS  Patient location during evaluation: PACU Anesthesia Type: General Level of consciousness: awake and alert Pain management: pain level controlled Vital Signs Assessment: post-procedure vital signs reviewed and stable Respiratory status: spontaneous breathing, nonlabored ventilation, respiratory function stable and patient connected to nasal cannula oxygen Cardiovascular status: blood pressure returned to baseline and stable Postop Assessment: no apparent nausea or vomiting Anesthetic complications: no   No notable events documented.   Last Vitals:  Vitals:   03/26/24 0919 03/26/24 0925  BP: (!) 92/56 (!) 96/52  Pulse: 76   Resp:    Temp:    SpO2:      Last Pain:  Vitals:   03/26/24 0912  TempSrc:   PainSc: 0-No pain                 Andrea Limes

## 2024-03-26 NOTE — Op Note (Signed)
 [facility] Patient Name: Jillian Holt Procedure Date: 03/26/2024 8:12 AM MRN: [id number] Date of Birth: 1966-06-10 Attending MD: Toribio Fortune , , [id number] CSN: [id number] Age: 58 Admit Type: Outpatient Procedure:                Colonoscopy Indications:              Abnormal CT of the GI tract - ileitis Providers:                Toribio Fortune, Harlene Lips, Daphne Mulch                            Technician, Technician Referring MD:              Medicines:                Monitored Anesthesia Care Complications:            No immediate complications. Estimated Blood Loss:     Estimated blood loss: none. Procedure:                Pre-Anesthesia Assessment:                           - Prior to the procedure, a History and Physical                            was performed, and patient medications, allergies                            and sensitivities were reviewed. The patient's                            tolerance of previous anesthesia was reviewed.                           - The risks and benefits of the procedure and the                            sedation options and risks were discussed with the                            patient. All questions were answered and informed                            consent was obtained.                           - ASA Grade Assessment: II - A patient with mild                            systemic disease.                           After obtaining informed consent, the colonoscope                            was passed under direct vision. Throughout the  procedure, the patient's blood pressure, pulse, and                            oxygen saturations were monitored continuously. The                            PCF-[id number] ([id number]) was introduced through the                            anus and advanced to the 20 cm into the ileum. The                            colonoscopy was performed without difficulty. The                             patient tolerated the procedure well. The quality                            of the bowel preparation was excellent. Scope In: 8:49:53 AM Scope Out: 9:08:49 AM Scope Withdrawal Time: 0 hours 14 minutes 10 seconds  Total Procedure Duration: 0 hours 18 minutes 56 seconds  Findings:      The perianal and digital rectal examinations were normal.      The terminal ileum appeared normal. Biopsies were taken with a cold       forceps for histology.      Two sessile polyps were found in the ascending colon. The polyps were 4       to 6 mm in size. These polyps were removed with a cold snare. Resection       and retrieval were complete.      Non-bleeding internal hemorrhoids were found during retroflexion. The       hemorrhoids were small. Impression:               - The examined portion of the ileum was normal.                            Biopsied.                           - Two 4 to 6 mm polyps in the ascending colon,                            removed with a cold snare. Resected and retrieved.                           - Non-bleeding internal hemorrhoids. Moderate Sedation:      Per Anesthesia Care Recommendation:           - Discharge patient to home (ambulatory).                           - Resume previous diet.                           - Await pathology results.                           -  Repeat colonoscopy for surveillance based on                            pathology results. Procedure Code(s):        --- Professional ---                           [phone], Colonoscopy, flexible; with removal of                            tumor(s), polyp(s), or other lesion(s) by snare                            technique                           45380, 59, Colonoscopy, flexible; with biopsy,                            single or multiple Diagnosis Code(s):        --- Professional ---                           K64.8, Other hemorrhoids                           D12.2,  Benign neoplasm of ascending colon                           R93.3, Abnormal findings on diagnostic imaging of                            other parts of digestive tract CPT copyright 2022 American Medical Association. All rights reserved. The codes documented in this report are preliminary and upon coder review may  be revised to meet current compliance requirements. Toribio Fortune, MD Toribio Fortune,  03/26/2024 9:17:40 AM This report has been signed electronically. Number of Addenda: 0

## 2024-03-26 NOTE — Interval H&P Note (Signed)
 History and Physical Interval Note:  03/26/2024 7:36 AM  Jillian Holt  has presented today for surgery, with the diagnosis of dysphagia, ileitis.  The various methods of treatment have been discussed with the patient and family. After consideration of risks, benefits and other options for treatment, the patient has consented to  Procedure(s) with comments: COLONOSCOPY (N/A) - 8:30am, ASA 3 EGD (ESOPHAGOGASTRODUODENOSCOPY) (N/A) DILATION, ESOPHAGUS (N/A) as a surgical intervention.  The patient's history has been reviewed, patient examined, no change in status, stable for surgery.  I have reviewed the patient's chart and labs.  Questions were answered to the patient's satisfaction.     [name]

## 2024-03-26 NOTE — Telephone Encounter (Signed)
 Mychart message sent to patient.

## 2024-03-27 ENCOUNTER — Encounter (HOSPITAL_COMMUNITY): Payer: Self-pay | Admitting: Gastroenterology

## 2024-03-27 ENCOUNTER — Encounter (INDEPENDENT_AMBULATORY_CARE_PROVIDER_SITE_OTHER): Payer: Self-pay | Admitting: *Deleted

## 2024-03-27 ENCOUNTER — Ambulatory Visit (INDEPENDENT_AMBULATORY_CARE_PROVIDER_SITE_OTHER): Payer: Self-pay | Admitting: Gastroenterology

## 2024-03-27 LAB — SURGICAL PATHOLOGY

## 2024-03-27 NOTE — Progress Notes (Signed)
 7 yr TCS noted in recall Patient result letter mailed procedure note and pathology result faxed to PCP

## 2024-03-28 ENCOUNTER — Telehealth: Payer: Self-pay | Admitting: Internal Medicine

## 2024-03-28 NOTE — Telephone Encounter (Signed)
 Spoke with the patient who states that she has been having episodes of lightheadedness for the past couple of weeks. She states that she notices it more when she is up and moving around but she did have an episode today while she was on her riding [employer], [employer]. She states that episodes come on quick and resolve quickly. She does not feel like she is going to pass out, just feels off balance. She does feel like she is staying hydrated although she admits to waking up parched most mornings. She is taking Entresto  and Toprol  as prescribed. She takes her Toprol  in the mornings. I advised patient to try switching her metoprolol  to the evenings. Patient reports the following recent blood pressures: 100/55; 148/69; 106/65. She does not keep up with her heart rate. Advised patient to stay hydrated, change positions slowly, and continue to monitor. I will send a message to Dr. Mallipeddi for any further recommendations.

## 2024-03-28 NOTE — Telephone Encounter (Signed)
 Returned call to pt. No answer. Left msg to call back.

## 2024-03-28 NOTE — Telephone Encounter (Signed)
 Pt c/o BP issue: STAT if pt c/o blurred vision, one-sided weakness or slurred speech  1. What are your last 5 BP readings? 100/55; 148/69; 106/65  2. Are you having any other symptoms (ex. Dizziness, headache, blurred vision, passed out)? Dizziness, feels like she will pass out  (Been feeling like this for several weeks)  3. What is your BP issue?

## 2024-03-28 NOTE — Telephone Encounter (Signed)
 Pt c/o medication issue:  1. Name of Medication:   ENTRESTO  24-26 MG    2. How are you currently taking this medication (dosage and times per day)?    3. Are you having a reaction (difficulty breathing--STAT)? no  4. What is your medication issue? Patient state that her insurance wants to see if she can get the generic brand of medication. Please advise

## 2024-03-29 NOTE — Telephone Encounter (Signed)
 Spoke with pt. She will hold Entresto , monitor BP and Hr then update our office next week.

## 2024-03-29 NOTE — Telephone Encounter (Signed)
 Per telephone note 9/12. Pt is to hold Entresto  and monitor blood pressure and hr.

## 2024-05-01 ENCOUNTER — Encounter (INDEPENDENT_AMBULATORY_CARE_PROVIDER_SITE_OTHER): Payer: Self-pay | Admitting: Gastroenterology

## 2024-05-13 ENCOUNTER — Other Ambulatory Visit: Payer: Self-pay

## 2024-05-13 DIAGNOSIS — Z1231 Encounter for screening mammogram for malignant neoplasm of breast: Secondary | ICD-10-CM

## 2024-06-03 ENCOUNTER — Encounter (INDEPENDENT_AMBULATORY_CARE_PROVIDER_SITE_OTHER): Payer: Self-pay | Admitting: Gastroenterology

## 2024-06-03 ENCOUNTER — Ambulatory Visit (INDEPENDENT_AMBULATORY_CARE_PROVIDER_SITE_OTHER): Admitting: Gastroenterology

## 2024-06-03 VITALS — BP 114/72 | HR 73 | Temp 97.1°F | Ht 65.0 in | Wt 206.4 lb

## 2024-06-03 DIAGNOSIS — K219 Gastro-esophageal reflux disease without esophagitis: Secondary | ICD-10-CM | POA: Insufficient documentation

## 2024-06-03 DIAGNOSIS — K50019 Crohn's disease of small intestine with unspecified complications: Secondary | ICD-10-CM

## 2024-06-03 NOTE — Patient Instructions (Signed)
 Please increase omeprazole  20mg  to twice daily, take one prior to breakfast and another prior to dinner in the evenings, let me know how this is working in the next few weeks and I can try to send a prescription to see if this is a bit cheaper with insurance.  Avoid greasy, spicy, fried, citrus foods, and be mindful that caffeine, carbonated drinks, chocolate and alcohol can increase reflux symptoms Stay upright 2-3 hours after eating, prior to lying down and avoid eating late in the evenings.   Follow up 1 year  It was a pleasure to see you today. I want to create trusting relationships with patients and provide genuine, compassionate, and quality care. I truly value your feedback! please be on the lookout for a survey regarding your visit with me today. I appreciate your input about our visit and your time in completing this!    Anil Havard L. Mairi Stagliano, MSN, APRN, AGNP-C Adult-Gerontology Nurse Practitioner [facility] at [facility]

## 2024-06-03 NOTE — Progress Notes (Addendum)
 Referring Provider: Atilano Deward ORN, MD Primary Care Physician:  Lari Elspeth BRAVO, MD Primary GI Physician: Dr. Eartha   Chief Complaint  Patient presents with   Follow-up    Pt arrives for follow up. Pt states as far as Education Officer, Environmental everything is fine. Abdominal pain has subsided.    HPI:   Jillian Holt is a 58 y.o. 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 Having some LLQ pain, some bloating. Taking iron PO. Occasional dysphagia, on omeprazole  20mg  daily   Recommended to schedule EGD/colonoscopy, start levsin , referral to nutrition for obesity  EGD and Colonoscopy as outlined below, advised to take omeprazole  40mg  BID for 4 weeks then go back to 20mg  daily  Labs in August with normal CBC, normal CMP   Present:  States feeling much better from GI standpoint. Previously taking entresto , states that after the procedures she felt somewhat lightheaded. Her entresto  was stopped and she feels much better.  No further abdominal pain. Having normal BMs without any issues. Feels dysphagia is resolved.  She feels GERD is better than previously. She has a sour taste in her mouth upon waking some mornings sometimes. She is sleeping with HOB elevated. Currently taking omeprazole  20mg  daily, unsure if symptoms were better on higher dose previously. No red flag symptoms. Patient denies melena, hematochezia, nausea, vomiting, diarrhea, constipation, dysphagia, odyonophagia, early satiety or weight loss.    Last Colonoscopy:- 03/2024 The examined portion of the ileum was normal.                            Biopsied.                           - Two 4 to 6  mm polyps in the ascending colon,                            removed with a cold snare. Resected and retrieved.                           - Non-bleeding internal hemorrhoids.  Last Endoscopy:- 03/2024 No endoscopic esophageal abnormality to explain                            patient's dysphagia. Esophagus dilated. Dilated.                           - 1 cm hiatal hernia.                           - Multiple gastric polyps. Resected and retrieved.                           - Normal examined duodenum.                           -  Biopsies were taken with a cold forceps for                            evaluation of eosinophilic esophagitis A. STOMACH POLYPS; POLYPECTOMY:  - Fundic gland polyps.  - Negative for H. pylori, intestinal metaplasia, dysplasia, and  malignancy.   B. ESOPHAGUS, BIOPSY:  - Unremarkable squamous mucosa.  - Negative for intraepithelial eosinophils, dysplasia, and malignancy.   C. SMALL BOWEL, BIOPSY:  - Small intestinal mucosa with focal reactive lymphoid aggregates.  - Negative for granulomas, dysplasia, and malignancy.   D. COLON POLYPS (2), ASCENDING; POLYPECTOMY:  - Tubular adenoma (2).  - Negative for high-grade dysplasia and malignancy.   Recommendations:  Repeat TCS 7 years  Filed Weights   06/03/24 1024  Weight: 206 lb 6.4 oz (93.6 kg)     Past Medical History:  Diagnosis Date   Dysfunction of left eustachian tube    GERD (gastroesophageal reflux disease)    Other fatigue    Palpitations     Past Surgical History:  Procedure Laterality Date   ABDOMINAL HYSTERECTOMY     BREAST BIOPSY Left    c section     CHOLECYSTECTOMY     COLONOSCOPY N/A 03/26/2024   Procedure: COLONOSCOPY;  Surgeon: Eartha Angelia Sieving, MD;  Location: [facility];  Service: Gastroenterology;  Laterality: N/A;  8:30am, ASA 3   ESOPHAGEAL DILATION N/A 03/26/2024   Procedure: DILATION, ESOPHAGUS;  Surgeon: Eartha Angelia Sieving, MD;  Location: [facility];   Service: Gastroenterology;  Laterality: N/A;   ESOPHAGOGASTRODUODENOSCOPY N/A 03/26/2024   Procedure: EGD (ESOPHAGOGASTRODUODENOSCOPY);  Surgeon: Eartha Angelia, Sieving, MD;  Location: [facility];  Service: Gastroenterology;  Laterality: N/A;    Current Outpatient Medications  Medication Sig Dispense Refill   ascorbic acid (VITAMIN C) 500 MG tablet Take 500 mg by mouth daily. (Patient taking differently: Take 500 mg by mouth daily. occasionally)     cetirizine (ZYRTEC) 10 MG tablet Take 10 mg by mouth daily.     cholecalciferol (VITAMIN D3) 25 MCG (1000 UNIT) tablet Take 3,000 Units by mouth daily.     cyanocobalamin 100 MCG tablet Take 100 mcg by mouth. As needed     Flaxseed, Linseed, (FLAX SEED OIL PO) Take 1 capsule by mouth daily. (Patient taking differently: Take 1 capsule by mouth daily. Occasionally.)     fluticasone (FLONASE) 50 MCG/ACT nasal spray Place 2 sprays into both nostrils daily. (Patient taking differently: Place 2 sprays into both nostrils as needed.)     hyoscyamine  (LEVSIN  SL) 0.125 MG SL tablet Place 1 tablet (0.125 mg total) under the tongue every 6 (six) hours as needed (abdominal pain, pain when swallowing). 180 tablet 3   MAGNESIUM PO Take by mouth once a week.     Menaquinone-7 (VITAMIN K2) 100 MCG CAPS Take 1 capsule by mouth daily.     metoprolol  succinate (TOPROL -XL) 25 MG 24 hr tablet TAKE 1 TABLET (25 MG TOTAL) BY MOUTH DAILY. 90 tablet 3   nitroGLYCERIN  (NITROSTAT ) 0.4 MG SL tablet Place 1 tablet (0.4 mg total) under the tongue every 5 (five) minutes x 3 doses as needed for chest pain (If no relief after 3rd dose, proceed to the ED or call 911). 25 tablet 2   omeprazole  (PRILOSEC) 20 MG capsule Take 20 mg by mouth daily.     omeprazole  (PRILOSEC) 40 MG capsule Take 1 capsule (40 mg total) by mouth 2 (two) times  daily. 60 capsule 0   Zinc Sulfate (ZINC 15 PO) Take 1 tablet by mouth once a week. As needed     citalopram (CELEXA) 20 MG tablet Take 20 mg by  mouth daily. (Patient not taking: Reported on 06/03/2024)     ENTRESTO  24-26 MG Take 1 tablet by mouth 2 (two) times daily. (Patient not taking: Reported on 06/03/2024) 60 tablet 9   No current facility-administered medications for this visit.    Allergies as of 06/03/2024 - Review Complete 06/03/2024  Allergen Reaction Noted   Bactrim [sulfamethoxazole-trimethoprim]  01/30/2019   Erythromycin  01/30/2019   Penicillins  01/30/2019    Social History   Socioeconomic History   Marital status: Married    Spouse name: Not on file   Number of children: Not on file   Years of education: Not on file   Highest education level: Not on file  Occupational History   Not on file  Tobacco Use   Smoking status: Never   Smokeless tobacco: Never  Vaping Use   Vaping status: Never Used  Substance and Sexual Activity   Alcohol use: Never   Drug use: Never   Sexual activity: Not on file  Other Topics Concern   Not on file  Social History Narrative   Not on file   Social Drivers of Health   Financial Resource Strain: Not on file  Food Insecurity: No Food Insecurity (01/02/2024)   Received from [facility]   Hunger Vital Sign    Within the past 12 months, you worried that your food would run out before you got the money to buy more.: Never true    Within the past 12 months, the food you bought just didn't last and you didn't have money to get more.: Never true  Transportation Needs: No Transportation Needs (01/02/2024)   [facility]   PRAPARE - Transportation    Lack of Transportation (Medical): No    Lack of Transportation (Non-Medical): No  Physical Activity: Insufficiently Active (01/02/2024)   [facility]   Exercise Vital Sign    On average, how many days per week do you engage in moderate to strenuous exercise (like a brisk walk)?: 1 day    On average, how many minutes do you engage in exercise at this level?: 60 min  Stress: No Stress Concern  Present (01/02/2024)   Received from [facility] - Occupational Stress Questionnaire    Do you feel stress - tense, restless, nervous, or anxious, or unable to sleep at night because your mind is troubled all the time - these days?: Not at all  Social Connections: Not on file    Review of systems General: negative for malaise, night sweats, fever, chills, weight loss Neck: Negative for lumps, goiter, pain and significant neck swelling Resp: Negative for cough, wheezing, dyspnea at rest CV: Negative for chest pain, leg swelling, palpitations, orthopnea GI: denies melena, hematochezia, nausea, vomiting, diarrhea, constipation, dysphagia, odyonophagia, early satiety or unintentional weight loss. +sour taste upon waking  MSK: Negative for joint pain or swelling, back pain, and muscle pain. Derm: Negative for itching or rash Psych: Denies depression, anxiety, memory loss, confusion. No homicidal or suicidal ideation.  Heme: Negative for prolonged bleeding, bruising easily, and swollen nodes. Endocrine: Negative for cold or heat intolerance, polyuria, polydipsia and goiter. Neuro: negative for tremor, gait imbalance, syncope and seizures. The remainder of the review of systems  is noncontributory.  Physical Exam: BP (!) 142/77   Pulse 73   Temp (!) 97.1 F (36.2 C)   Ht 5' 5 (1.651 m)   Wt 206 lb 6.4 oz (93.6 kg)   LMP 01/09/2019   BMI 34.35 kg/m  General:   Alert and oriented. No distress noted. Pleasant and cooperative.  Head:  Normocephalic and atraumatic. Eyes:  Conjuctiva clear without scleral icterus. Mouth:  Oral mucosa pink and moist. Good dentition. No lesions. Heart: Normal rate and rhythm, s1 and s2 heart sounds present.  Lungs: Clear lung sounds in all lobes. Respirations equal and unlabored. Abdomen:  +BS, soft, non-tender and non-distended. No rebound or guarding. No HSM or masses noted. Derm: No palmar erythema or  jaundice Msk:  Symmetrical without gross deformities. Normal posture. Extremities:  Without edema. Neurologic:  Alert and  oriented x4 Psych:  Alert and cooperative. Normal mood and affect.  Invalid input(s): 6 MONTHS   ASSESSMENT: Jillian Holt is a 58 y.o. female presenting today for follow up of dysphagia, GERD and history of terminal ileitis with abdominal pain  GERD/dysphagia: dysphagia has resolved, recent EGD without findings to explain dysphagia. Having some sour taste upon waking in the mornings but denies any acid regurgitation or heartburn. At this time, recommend continued good reflux precautions, increase omeprazole  20mg  to BID dosing, she can let me know how this Is working in the next few weeks and I can try to send an Rx to see if insurance will cover PPI.   Terminal ileitis with Abdominal Pain: previous findings of terminal ileitis on imaging with LLQ pain, colonoscopy as outlined above with findings of a few polyps. Abdominal pain has resolved. Ileitis likely secondary to infectious process. She should make me aware if this recurs.    PLAN:  -continue omeprazole  20mg , increase to BID (pt to update me in a few weeks, can send Rx for PPI to see if insurance will cover) -continue good reflux precautions  -pt to make me aware if abdominal pain recurs   All questions were answered, patient verbalized understanding and is in agreement with plan as outlined above.    Follow Up: 1 year   Jillian Holt L. Mariette, MSN, APRN, AGNP-C Adult-Gerontology Nurse Practitioner [facility]  I have reviewed the note and agree with the APP's assessment as described in this progress note  Toribio Fortune, MD Gastroenterology and Hepatology [facility] Gastroenterology

## 2024-06-11 ENCOUNTER — Telehealth: Payer: Self-pay

## 2024-06-11 NOTE — Telephone Encounter (Signed)
 Received appointment request from pt:   Appointment Request From: Jillian Holt   With Provider: Diannah SQUIBB Mallipeddi [ [facility]]   Preferred Date Range: Any   Preferred Times: Any   Reason for visit: Office Visit   Health Maintenance Topic:    Comments: Anxiety is getting bad

## 2024-06-11 NOTE — Telephone Encounter (Signed)
 Attempted to contact pt regarding comment. Left a message for patient to call office back.

## 2024-06-12 NOTE — Telephone Encounter (Signed)
 Attempted to contact pt regarding comment. Left a message for patient to call office back.

## 2024-06-20 NOTE — Telephone Encounter (Signed)
 Attempted to contact pt regarding comment. Left a message for patient to call office back.

## 2024-06-24 NOTE — Telephone Encounter (Signed)
 Attempted to contact pt regarding comment. Left a message for patient to call office back.

## 2024-06-25 ENCOUNTER — Inpatient Hospital Stay: Admission: RE | Admit: 2024-06-25 | Discharge: 2024-06-25

## 2024-06-25 DIAGNOSIS — Z1231 Encounter for screening mammogram for malignant neoplasm of breast: Secondary | ICD-10-CM

## 2024-07-01 ENCOUNTER — Other Ambulatory Visit: Payer: Self-pay

## 2024-07-01 DIAGNOSIS — R928 Other abnormal and inconclusive findings on diagnostic imaging of breast: Secondary | ICD-10-CM

## 2024-07-09 ENCOUNTER — Ambulatory Visit
Admission: RE | Admit: 2024-07-09 | Discharge: 2024-07-09 | Disposition: A | Discharge status: Home / Self Care | Source: Ambulatory Visit

## 2024-07-09 DIAGNOSIS — R928 Other abnormal and inconclusive findings on diagnostic imaging of breast: Secondary | ICD-10-CM

## 2024-07-15 ENCOUNTER — Ambulatory Visit (HOSPITAL_COMMUNITY)

## 2024-08-09 ENCOUNTER — Ambulatory Visit: Admitting: Internal Medicine

## 2024-08-10 ENCOUNTER — Other Ambulatory Visit: Payer: Self-pay | Admitting: Internal Medicine

## 2024-08-30 ENCOUNTER — Ambulatory Visit: Admitting: Internal Medicine
